# Patient Record
Sex: Female | Born: 1985 | Race: White | Hispanic: No | Marital: Married | Smoking: Never smoker
Health system: Southern US, Community
[De-identification: ages and names within clinical notes are randomized; demographics above are authoritative.]

## PROBLEM LIST (undated history)

## (undated) DIAGNOSIS — Z803 Family history of malignant neoplasm of breast: Secondary | ICD-10-CM

## (undated) DIAGNOSIS — O99345 Other mental disorders complicating the puerperium: Secondary | ICD-10-CM

## (undated) DIAGNOSIS — F53 Postpartum depression: Secondary | ICD-10-CM

## (undated) DIAGNOSIS — J45909 Unspecified asthma, uncomplicated: Secondary | ICD-10-CM

## (undated) DIAGNOSIS — Z8041 Family history of malignant neoplasm of ovary: Secondary | ICD-10-CM

## (undated) HISTORY — DX: Postpartum depression: F53.0

## (undated) HISTORY — DX: Other mental disorders complicating the puerperium: O99.345

## (undated) HISTORY — DX: Unspecified asthma, uncomplicated: J45.909

## (undated) HISTORY — DX: Family history of malignant neoplasm of ovary: Z80.41

## (undated) HISTORY — DX: Family history of malignant neoplasm of breast: Z80.3

---
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
## 2013-04-19 DIAGNOSIS — Z9189 Other specified personal risk factors, not elsewhere classified: Secondary | ICD-10-CM

## 2013-04-19 HISTORY — DX: Other specified personal risk factors, not elsewhere classified: Z91.89

## 2013-07-13 ENCOUNTER — Observation Stay: Payer: Self-pay | Admitting: Obstetrics and Gynecology

## 2013-07-14 ENCOUNTER — Inpatient Hospital Stay: Payer: Self-pay | Admitting: Obstetrics and Gynecology

## 2013-07-14 LAB — CBC WITH DIFFERENTIAL/PLATELET
Basophil #: 0.1 10*3/uL (ref 0.0–0.1)
Basophil %: 0.5 %
Eosinophil #: 0.2 10*3/uL (ref 0.0–0.7)
Eosinophil %: 1.3 %
HCT: 39.2 % (ref 35.0–47.0)
HGB: 13.2 g/dL (ref 12.0–16.0)
Lymphocyte #: 1.8 10*3/uL (ref 1.0–3.6)
Lymphocyte %: 9.4 %
MCH: 30.4 pg (ref 26.0–34.0)
MCHC: 33.7 g/dL (ref 32.0–36.0)
MCV: 90 fL (ref 80–100)
Monocyte #: 1.4 x10 3/mm — ABNORMAL HIGH (ref 0.2–0.9)
Monocyte %: 7.3 %
Neutrophil #: 15.6 10*3/uL — ABNORMAL HIGH (ref 1.4–6.5)
Neutrophil %: 81.5 %
Platelet: 211 10*3/uL (ref 150–440)
RBC: 4.35 10*6/uL (ref 3.80–5.20)
RDW: 13.7 % (ref 11.5–14.5)
WBC: 19.1 10*3/uL — ABNORMAL HIGH (ref 3.6–11.0)

## 2013-07-15 LAB — HEMATOCRIT: HCT: 31.8 % — ABNORMAL LOW (ref 35.0–47.0)

## 2014-08-27 NOTE — H&P (Signed)
L&D Evaluation:  History Expanded:  HPI 29 yo G1 at 96w3dgestational age by LMP consistent with 8 week ultrasound.  Patient's pregnancy complicated by history of asthma.  She presents with regular uterine contractions. She notes positive fetal movement, no leakage of fluid, and no vaginal bleeding.   Blood Type (Maternal) A positive   Group B Strep Results Maternal (Result >5wks must be treated as unknown) negative   Maternal HIV Negative   Maternal Syphilis Ab Nonreactive   Maternal Varicella Immune   Rubella Results (Maternal) nonimmune   [facility]   Patient's Medical History 1) asthma, 2) family history of breast cancer, 3) family history of ovarian cancer   Patient's Surgical History none   Medications Pre Natal Vitamins   Allergies antihistamines (bronchosmasms)   Social History none   Family History Non-Contributory   ROS:  ROS All systems were reviewed.  HEENT, CNS, GI, GU, Respiratory, CV, Renal and Musculoskeletal systems were found to be normal., Unless otherwise noted in HPI   Exam:  Vital Signs T 98.6, BP 128/81, P96, RR 20   General no apparent distress   Mental Status clear   Chest clear   Heart normal sinus rhythm   Abdomen gravid, non-tender   Estimated Fetal Weight 7.5 pounds   Fetal Position vtx   Back no CVAT   Edema no edema   Pelvic no external lesions, 5cm per RN   Mebranes Intact   FHT normal rate with no decels   FHT Description 120/mod var/+accels/no decels   Ucx irregular, not tracing well   Skin no lesions   Impression:  Impression active labor, reactive NST   Plan:  Comments 1) Labor: expectant management  2) Fetus - category I tracing  3) PNL A positive / ABSC negative / RNI - MMR postpartum / VZI / HIV neg / RPR NR / HBsAg neg / declined genetic screening / 1-hr OGTT 99 / GBS negative  4) TDAP given 04/27/13  5) Disposition - home postpartum   Labs:  Lab Results:  Routine Hem:  28-Mar-15 01:19    WBC (CBC)  19.1  RBC (CBC) 4.35  Hemoglobin (CBC) 13.2  Hematocrit (CBC) 39.2  Platelet Count (CBC) 211  MCV 90  MCH 30.4  MCHC 33.7  RDW 13.7  Neutrophil % 81.5  Lymphocyte % 9.4  Monocyte % 7.3  Eosinophil % 1.3  Basophil % 0.5  Neutrophil #  15.6  Lymphocyte # 1.8  Monocyte #  1.4  Eosinophil # 0.2  Basophil # 0.1 (Result(s) reported on 14 Jul 2013 at 02:15AM.)   Electronic Signatures: JWill Bonnet(MD)  (Signed 28-Mar-15 02:53)  Authored: L&D Evaluation, Labs   Last Updated: 28-Mar-15 02:53 by JWill Bonnet(MD)

## 2016-04-22 LAB — HM PAP SMEAR: HM PAP: NEGATIVE

## 2017-05-04 ENCOUNTER — Ambulatory Visit (INDEPENDENT_AMBULATORY_CARE_PROVIDER_SITE_OTHER): Payer: Managed Care, Other (non HMO) | Admitting: Advanced Practice Midwife

## 2017-05-04 ENCOUNTER — Encounter: Payer: Self-pay | Admitting: Advanced Practice Midwife

## 2017-05-04 VITALS — BP 140/80 | HR 60 | Ht 66.5 in | Wt 178.0 lb

## 2017-05-04 DIAGNOSIS — Z3044 Encounter for surveillance of vaginal ring hormonal contraceptive device: Secondary | ICD-10-CM

## 2017-05-04 DIAGNOSIS — Z01419 Encounter for gynecological examination (general) (routine) without abnormal findings: Secondary | ICD-10-CM

## 2017-05-04 DIAGNOSIS — Z0000 Encounter for general adult medical examination without abnormal findings: Secondary | ICD-10-CM

## 2017-05-04 MED ORDER — ETONOGESTREL-ETHINYL ESTRADIOL 0.12-0.015 MG/24HR VA RING: 1.0000 | VAGINAL_RING | VAGINAL | 11 refills | Status: DC

## 2017-05-04 NOTE — Progress Notes (Addendum)
Patient ID: Samantha Kim, female   DOB: 02-07-1986, 32 y.o.   MRN: [id number]     Gynecology Annual Exam  PCP: Patient, No Pcp Per  Chief Complaint:  Chief Complaint  Patient presents with  . Gynecologic Exam    History of Present Illness: Patient is a 32 y.o. G1P1001 presents for annual exam. The patient has no complaints today. She does have questions regarding irregular heart beat noted today by RN at check in. She has noticed some fluttering but denies chest pain, dizziness, light headed, faintness, shortness of breath or swelling in her extremities. Discussion about possible causes. She will follow up with cardiologist. She also has a question about an existing mole. The mole has been there but recently had some peeling. She plans to follow up with dermatologist for screening.  LMP: Patient's last menstrual period was 04/21/2017 (exact date). Average Interval: regular, 28 days Duration of flow: 5 days Heavy Menses: no Clots: no Intermenstrual Bleeding: no Postcoital Bleeding: no Dysmenorrhea: no  The patient is sexually active. She currently uses NuvaRing vaginal inserts for contraception. She denies dyspareunia.  The patient does perform self breast exams.  There is notable family history of breast or ovarian cancer in her family. She has previously been offered genetic screening and declined.  The patient wears seatbelts: yes.   The patient has regular exercise: yes.  She admits healthy lifestyle diet, exercise and hydration.   The patient denies current symptoms of depression.    Review of Systems: Review of Systems  Constitutional: Negative.   HENT: Negative.   Eyes: Negative.   Respiratory: Negative.   Cardiovascular: Positive for palpitations.       She has noticed fluttering  Gastrointestinal: Negative.   Genitourinary: Negative.   Musculoskeletal: Negative.   Skin: Negative.        Various moles without significant changes. Mole between breasts has recently  had peeling skin.  Neurological: Negative.   Endo/Heme/Allergies: Negative.   Psychiatric/Behavioral: Negative.     Past Medical History:  Past Medical History:  Diagnosis Date  . Asthma   . Post partum depression     Past Surgical History:  Past Surgical History:  Procedure Laterality Date  . WISDOM TOOTH EXTRACTION  2003   age 6    Gynecologic History:  Patient's last menstrual period was 04/21/2017 (exact date). Contraception: NuvaRing vaginal inserts Last Pap: 2018 Results were: no abnormalities   Obstetric History: G1P1001  Family History:  Family History  Problem Relation Age of Onset  . Hypertension Mother   . Hypertension Father   . Cancer Paternal Aunt 40       ovarian  . Breast cancer Maternal Grandmother 30  . Cancer Maternal Grandmother 60       lumg  . Hypertension Maternal Grandmother   . Stroke Maternal Grandmother   . Thyroid disease Maternal Grandmother   . Cancer Maternal Grandfather 80       lymphoma  . Hypertension Maternal Grandfather   . Breast cancer Paternal Grandmother 60  . Diabetes Paternal Grandfather     Social History:  Social History   Socioeconomic History  . Marital status: Married    Spouse name: Not on file  . Number of children: Not on file  . Years of education: Not on file  . Highest education level: Not on file  Social Needs  . Financial resource strain: Not on file  . Food insecurity - worry: Not on file  . Food insecurity - inability: Not  on file  . Transportation needs - medical: Not on file  . Transportation needs - non-medical: Not on file  Occupational History  . Not on file  Tobacco Use  . Smoking status: Never Smoker  . Smokeless tobacco: Never Used  Substance and Sexual Activity  . Alcohol use: Yes    Comment: rarely  . Drug use: No  . Sexual activity: Yes    Birth control/protection: Inserts  Other Topics Concern  . Not on file  Social History Narrative  . Not on file    Allergies:    Allergies  Allergen Reactions  . Cetirizine Other (See Comments)    With severe bronchospasm  . Other Other (See Comments)    Asthma flare up antihistamines    Medications: Prior to Admission medications   Medication Sig Start Date End Date Taking? Authorizing Provider  albuterol (PROVENTIL HFA;VENTOLIN HFA) 108 (90 Base) MCG/ACT inhaler Inhale 2 puffs into the lungs as needed. 08/31/16  Yes [provider]  Calcium Carbonate-Vitamin D 600-400 MG-UNIT chew tablet Chew 2 capsules by mouth daily.   Yes [provider]  cholecalciferol (VITAMIN D) 1000 units tablet Take 1,000 Units by mouth daily.   Yes [provider]  etonogestrel-ethinyl estradiol (NUVARING) 0.12-0.015 MG/24HR vaginal ring Place 1 each vaginally every 30 (thirty) days. 05/04/17  Yes Tresea MallGledhill, Jartavious Mckimmy, CNM  montelukast (SINGULAIR) 10 MG tablet Take 1 tablet by mouth daily. 12/23/16  Yes [provider]    Physical Exam Vitals: Blood pressure 140/80, pulse 60, height 5' 6.5" (1.689 m), weight 178 lb (80.7 kg), last menstrual period 04/21/2017.  General: NAD HEENT: normocephalic, anicteric Thyroid: no enlargement, no palpable nodules Pulmonary: No increased work of breathing, CTAB Cardiovascular: irregular heart rate- generally slow with occasional extra beats, distal pulses 2+ Breast: Breast symmetrical, no tenderness, no palpable nodules or masses, no skin or nipple retraction present, no nipple discharge.  No axillary or supraclavicular lymphadenopathy. Abdomen: NABS, soft, non-tender, non-distended.  Umbilicus without lesions.  No hepatomegaly, splenomegaly or masses palpable. No evidence of hernia  Genitourinary:  Deferred for no PAP smear interval and no concerns. Shared decision making. Extremities: no edema, erythema, or tenderness Neurologic: Grossly intact Psychiatric: mood appropriate, affect full Skin: mole between breasts approximately 5 mm circular with slightly darker area  in center where skin peeled   Assessment: 32 y.o. G1P1001 routine annual exam  Plan: Problem List Items Addressed This Visit    None    Visit Diagnoses    Well woman exam without gynecological exam    -  Primary   Encounter for surveillance of vaginal ring hormonal contraceptive device       Relevant Medications   etonogestrel-ethinyl estradiol (NUVARING) 0.12-0.015 MG/24HR vaginal ring      1) STI screening was offered and declined  2) ASCCP guidelines and rational discussed.  Patient opts for every 3 years screening interval  3) Contraception - Nuva Ring  4) Routine healthcare maintenance including cholesterol, diabetes screening discussed managed by PCP   5) Patient to arrange follow up with cardiologist and dermatologist per discussion today  6) Follow up 1 year for routine annual exam  Tresea MallJane Jeffory Snelgrove, CNM

## 2017-12-05 ENCOUNTER — Other Ambulatory Visit: Payer: Self-pay | Admitting: Certified Nurse Midwife

## 2017-12-05 DIAGNOSIS — Z3044 Encounter for surveillance of vaginal ring hormonal contraceptive device: Secondary | ICD-10-CM

## 2017-12-05 MED ORDER — ETONOGESTREL-ETHINYL ESTRADIOL 0.12-0.015 MG/24HR VA RING: 1.0000 | VAGINAL_RING | VAGINAL | 1 refills | Status: DC

## 2018-02-24 DIAGNOSIS — E559 Vitamin D deficiency, unspecified: Secondary | ICD-10-CM | POA: Insufficient documentation

## 2018-02-24 DIAGNOSIS — J4520 Mild intermittent asthma, uncomplicated: Secondary | ICD-10-CM | POA: Insufficient documentation

## 2018-02-24 DIAGNOSIS — E663 Overweight: Secondary | ICD-10-CM | POA: Insufficient documentation

## 2018-04-20 ENCOUNTER — Telehealth: Payer: Self-pay

## 2018-04-20 NOTE — Telephone Encounter (Signed)
Pt states refill of nuvaring was denied. Annual not due until Feb.  If 1-2 more nuvarings could be called in that would be great.  [phone]  Spoke c pt who states pharm told her she was out of refills.  Adv 3 c one ref sent in Aug so she should have enough.  Adv I will call [facility] for pt to see about it tomorrow and if still problems to call me back.  [place] and [place] who said she does in fact have refills.  They will notify pt.

## 2018-05-31 ENCOUNTER — Ambulatory Visit (INDEPENDENT_AMBULATORY_CARE_PROVIDER_SITE_OTHER): Payer: Managed Care, Other (non HMO) | Admitting: Advanced Practice Midwife

## 2018-05-31 ENCOUNTER — Encounter: Payer: Self-pay | Admitting: Advanced Practice Midwife

## 2018-05-31 VITALS — BP 122/74 | Ht 66.5 in | Wt 162.0 lb

## 2018-05-31 DIAGNOSIS — Z3044 Encounter for surveillance of vaginal ring hormonal contraceptive device: Secondary | ICD-10-CM

## 2018-05-31 DIAGNOSIS — Z01419 Encounter for gynecological examination (general) (routine) without abnormal findings: Secondary | ICD-10-CM | POA: Diagnosis not present

## 2018-05-31 DIAGNOSIS — N9489 Other specified conditions associated with female genital organs and menstrual cycle: Secondary | ICD-10-CM

## 2018-05-31 DIAGNOSIS — Z0000 Encounter for general adult medical examination without abnormal findings: Secondary | ICD-10-CM

## 2018-05-31 MED ORDER — ETONOGESTREL-ETHINYL ESTRADIOL 0.12-0.015 MG/24HR VA RING: 1.0000 | VAGINAL_RING | VAGINAL | 4 refills | Status: DC

## 2018-05-31 NOTE — Patient Instructions (Signed)

## 2018-05-31 NOTE — Progress Notes (Signed)
Patient ID: Samantha CanterburyJennifer Kim, female   DOB: 01/14/1986, 33 y.o.   MRN: [id number]     Gynecology Annual Exam   PCP: Patient, No Pcp Per  Chief Complaint:  Chief Complaint  Patient presents with  . Annual Exam    History of Present Illness: Patient is a 33 y.o. G1P1001 presents for annual exam. The patient has complaint today of spotting in between periods for the past 2 months. She has had pain in her right lower quadrant at the same time as the spotting.   She followed up with cardiology after last year's visit and was found to have PVCs. She has no restrictions per cardiology. She also saw the dermatologist regarding the mole between her breasts and there was no problem with that.    LMP: Patient's last menstrual period was 05/17/2018. Average Interval: regular, 28 days Duration of flow: 5 days Heavy Menses: no Clots: no Intermenstrual Bleeding: yes Postcoital Bleeding: no Dysmenorrhea: no  The patient is sexually active. She currently uses NuvaRing vaginal inserts for contraception. She denies dyspareunia.  The patient does perform self breast exams.  There is notable family history of breast or ovarian cancer in her family. The patient continues to decline genetic screening.  The patient wears seatbelts: yes.   The patient has regular exercise: She is active with [employer] 5 days per week. She admits healthy diet and adequate hydration.    The patient denies current symptoms of depression.    Review of Systems: Review of Systems  Constitutional: Negative.   HENT: Negative.   Eyes: Negative.   Respiratory: Negative.   Cardiovascular: Negative.   Gastrointestinal: Negative.   Genitourinary:       Spotting between periods and right lower quadrant pain  Musculoskeletal: Negative.   Skin: Negative.   Neurological: Negative.   Endo/Heme/Allergies: Negative.   Psychiatric/Behavioral: Negative.     Past Medical History:  Past Medical History:  Diagnosis  Date  . Asthma   . Post partum depression     Past Surgical History:  Past Surgical History:  Procedure Laterality Date  . WISDOM TOOTH EXTRACTION  2003   age 33    Gynecologic History:  Patient's last menstrual period was 05/17/2018. Contraception: NuvaRing vaginal inserts Last Pap: 2 years ago Results were: no abnormalities   Obstetric History: G1P1001  Family History:  Family History  Problem Relation Age of Onset  . Hypertension Mother   . Hypertension Father   . Cancer Paternal Aunt 40       ovarian  . Breast cancer Maternal Grandmother 30  . Cancer Maternal Grandmother 60       lumg  . Hypertension Maternal Grandmother   . Stroke Maternal Grandmother   . Thyroid disease Maternal Grandmother   . Cancer Maternal Grandfather 80       lymphoma  . Hypertension Maternal Grandfather   . Breast cancer Paternal Grandmother [id number]  . Diabetes Paternal Grandfather     Social History:  Social History   Socioeconomic History  . Marital status: Married    Spouse name: Not on file  . Number of children: Not on file  . Years of education: Not on file  . Highest education level: Not on file  Occupational History  . Not on file  Social Needs  . Financial resource strain: Not on file  . Food insecurity:    Worry: Not on file    Inability: Not on file  . Transportation needs:  Medical: Not on file    Non-medical: Not on file  Tobacco Use  . Smoking status: Never Smoker  . Smokeless tobacco: Never Used  Substance and Sexual Activity  . Alcohol use: Yes    Comment: rarely  . Drug use: No  . Sexual activity: Yes    Birth control/protection: Inserts  Lifestyle  . Physical activity:    Days per week: Not on file    Minutes per session: Not on file  . Stress: Not on file  Relationships  . Social connections:    Talks on phone: Not on file    Gets together: Not on file    Attends religious service: Not on file    Active member of club or organization: Not on file     Attends meetings of clubs or organizations: Not on file    Relationship status: Not on file  . Intimate partner violence:    Fear of current or ex partner: Not on file    Emotionally abused: Not on file    Physically abused: Not on file    Forced sexual activity: Not on file  Other Topics Concern  . Not on file  Social History Narrative  . Not on file    Allergies:  Allergies  Allergen Reactions  . Cetirizine Other (See Comments)    With severe bronchospasm  . Other Other (See Comments)    Asthma flare up antihistamines    Medications: Prior to Admission medications   Medication Sig Start Date End Date Taking? Authorizing Provider  albuterol (PROVENTIL HFA;VENTOLIN HFA) 108 (90 Base) MCG/ACT inhaler Inhale 2 puffs into the lungs as needed. 08/31/16  Yes [provider]  Calcium Carbonate-Vitamin D 600-400 MG-UNIT chew tablet Chew 2 capsules by mouth daily.   Yes [provider]  cholecalciferol (VITAMIN D) 1000 units tablet Take 1,000 Units by mouth daily.   Yes [provider]  etonogestrel-ethinyl estradiol (NUVARING) 0.12-0.015 MG/24HR vaginal ring Place 1 each vaginally every 30 (thirty) days. 05/31/18  Yes Tresea Mall, CNM  montelukast (SINGULAIR) 10 MG tablet Take 1 tablet by mouth daily. 12/23/16  Yes [provider]  Loratadine 10 MG CAPS Take by mouth.    [provider]  Multiple Vitamin (MULTIVITAMIN) capsule Take by mouth.    [provider]    Physical Exam Vitals: Blood pressure 122/74, height 5' 6.5" (1.689 m), weight 162 lb (73.5 kg), last menstrual period 05/17/2018.  General: NAD HEENT: normocephalic, anicteric Thyroid: no enlargement, no palpable nodules Pulmonary: No increased work of breathing, CTAB Cardiovascular: RRR, distal pulses 2+ Breast: Breast symmetrical, no tenderness, no palpable nodules or masses, no skin or nipple retraction present, no nipple discharge.  No axillary or  supraclavicular lymphadenopathy. Abdomen: NABS, soft, non-tender, non-distended.  Umbilicus without lesions.  No hepatomegaly, splenomegaly or masses palpable. No evidence of hernia  Genitourinary:  External: Normal external female genitalia.  Normal urethral meatus, normal Bartholin's and Skene's glands.    Vagina: Normal vaginal mucosa, no evidence of prolapse.    Cervix: Grossly normal in appearance, no bleeding, no CMT  Uterus: Non-enlarged, mobile, normal contour.    Adnexa: left ovary non-enlarged, mild tenderness. Right ovary with mild enlargement and moderate tenderness to palpation   Rectal: deferred  Lymphatic: no evidence of inguinal lymphadenopathy Extremities: no edema, erythema, or tenderness Neurologic: Grossly intact Psychiatric: mood appropriate, affect full     Assessment: 33 y.o. G1P1001 routine annual exam  Plan: Problem List Items Addressed This Visit  None    Visit Diagnoses    Well woman exam without gynecological exam    -  Primary   Pain of ovary       Relevant Orders   US PELVIS TRANSVANGINAL NON-OB (TV ONLY)   Encounter for surveillance of vaginal ring hormonal contraceptive device       Relevant Medications   etonogestrel-ethinyl estradiol (NUVARING) 0.12-0.015 MG/24HR vaginal ring      1) STI screening  was offered and declined  2)  ASCCP guidelines and rationale discussed.  Patient opts for every 3 years screening interval  3) Contraception - the patient is currently using  NuvaRing vaginal inserts.  She is happy with her current form of contraception and plans to continue  4) Routine healthcare maintenance including cholesterol, diabetes screening discussed managed by PCP  5) Return in 1 week (on 06/07/2018). gyn ultrasound   Tresea MallJane Amaiya Scruton, CNM [facility], [facility] 05/31/2018, 3:06 PM

## 2018-06-23 ENCOUNTER — Encounter: Payer: Self-pay | Admitting: Advanced Practice Midwife

## 2018-06-23 ENCOUNTER — Ambulatory Visit (INDEPENDENT_AMBULATORY_CARE_PROVIDER_SITE_OTHER): Payer: Managed Care, Other (non HMO)

## 2018-06-23 ENCOUNTER — Ambulatory Visit (INDEPENDENT_AMBULATORY_CARE_PROVIDER_SITE_OTHER): Payer: Managed Care, Other (non HMO) | Admitting: Advanced Practice Midwife

## 2018-06-23 ENCOUNTER — Other Ambulatory Visit: Payer: Self-pay

## 2018-06-23 VITALS — BP 98/60 | HR 48 | Ht 66.5 in | Wt 162.0 lb

## 2018-06-23 DIAGNOSIS — N9489 Other specified conditions associated with female genital organs and menstrual cycle: Secondary | ICD-10-CM | POA: Diagnosis not present

## 2018-06-23 DIAGNOSIS — R102 Pelvic and perineal pain: Secondary | ICD-10-CM

## 2018-06-23 DIAGNOSIS — Z09 Encounter for follow-up examination after completed treatment for conditions other than malignant neoplasm: Secondary | ICD-10-CM

## 2018-06-23 NOTE — Progress Notes (Signed)
Patient ID: Samantha Kim, female   DOB: [birth year], 33 y.o.   MRN: [id number]  Reason for Visit: Follow-up (u/s cyst?), Right lower quadrant pain    Subjective:     HPI:  Samantha Kim is a 33 y.o. female here today for ultrasound follow up for right lower quadrant pain. She notes the pain has stopped since last visit and she did not have intermenstrual bleeding with her last period. She has no complaints today.  Past Medical History:  Diagnosis Date  . Asthma   . Post partum depression    Family History  Problem Relation Age of Onset  . Hypertension Mother   . Hypertension Father   . Cancer Paternal Aunt 40       ovarian  . Breast cancer Maternal Grandmother 30  . Cancer Maternal Grandmother 60       lumg  . Hypertension Maternal Grandmother   . Stroke Maternal Grandmother   . Thyroid disease Maternal Grandmother   . Cancer Maternal Grandfather 80       lymphoma  . Hypertension Maternal Grandfather   . Breast cancer Paternal Grandmother 47  . Diabetes Paternal Grandfather    Past Surgical History:  Procedure Laterality Date  . WISDOM TOOTH EXTRACTION  2003   age 75    Short Social History:  Social History   Tobacco Use  . Smoking status: Never Smoker  . Smokeless tobacco: Never Used  Substance Use Topics  . Alcohol use: Yes    Comment: rarely    Allergies  Allergen Reactions  . Cetirizine Other (See Comments)    With severe bronchospasm  . Other Other (See Comments)    Asthma flare up antihistamines    Current Outpatient Medications  Medication Sig Dispense Refill  . albuterol (PROVENTIL HFA;VENTOLIN HFA) 108 (90 Base) MCG/ACT inhaler Inhale 2 puffs into the lungs as needed.    . Calcium Carbonate-Vitamin D 600-400 MG-UNIT chew tablet Chew 2 capsules by mouth daily.    . cholecalciferol (VITAMIN D) 1000 units tablet Take 1,000 Units by mouth daily.    Marland Kitchen etonogestrel-ethinyl estradiol (NUVARING) 0.12-0.015 MG/24HR vaginal ring Place 1 each  vaginally every 30 (thirty) days. 3 each 4  . Loratadine 10 MG CAPS Take by mouth.    . montelukast (SINGULAIR) 10 MG tablet Take 1 tablet by mouth daily.    . Multiple Vitamin (MULTIVITAMIN) capsule Take by mouth.     No current facility-administered medications for this visit.     Review of Systems  Constitutional: Negative.   HENT: Negative.   Eyes: Negative.   Respiratory: Negative.   Cardiovascular: Negative.   Gastrointestinal: Negative.   Genitourinary: Negative.   Musculoskeletal: Negative.   Skin: Negative.   Neurological: Negative.   Endo/Heme/Allergies: Negative.   Psychiatric/Behavioral: Negative.        Objective:  Objective   Vitals:   06/23/18 0858  BP: 98/60  Pulse: (!) 48  Weight: 162 lb (73.5 kg)  Height: 5' 6.5" (1.689 m)   Body mass index is 25.76 kg/m. Constitutional: Well nourished, well developed female in no acute distress.  HEENT: normal Skin: Warm and dry.  Respiratory:  Normal respiratory effort Psych: Alert and Oriented x3. No memory deficits. Normal mood and affect.    Data: Patient Name: Samantha Kim DOB: 1985-12-06 MRN: [id number] ULTRASOUND REPORT  Location: [facility]  Date of Service: 06/23/2018    Indications:Pelvic Pain right Findings:  The uterus is anteverted and measures 7.3 x 4.2 x 3.5cm. Echo  texture is homogenous without evidence of focal masses.  The Endometrium measures 7.6 mm.  Right Ovary measures 2.5 x 1.8 x 1.5 cm. It is normal in appearance. Left Ovary measures 2.2 x 1.6 x 1.4 cm. It is normal in appearance. Survey of the adnexa demonstrates no adnexal masses. There is no free fluid in the cul de sac.  Bowel seen in right lower quadrant, patient's area of pain. No obvious abnormality.    Impression: 1. Normal gyn ultrasound.   Recommendations: 1.Clinical correlation with the patient's History and Physical Exam.  Darlina Guys, RDMS RVT     Assessment/Plan:     33 yo G1P1 female,  follow up ultrasound with no abnormalities  RTC as needed and in 1 year for annual exam  Tresea Mall CNM [facility]

## 2018-10-18 DIAGNOSIS — I493 Ventricular premature depolarization: Secondary | ICD-10-CM

## 2018-10-18 HISTORY — DX: Ventricular premature depolarization: I49.3

## 2019-06-04 ENCOUNTER — Encounter: Payer: Self-pay | Admitting: Obstetrics and Gynecology

## 2019-06-04 ENCOUNTER — Ambulatory Visit (INDEPENDENT_AMBULATORY_CARE_PROVIDER_SITE_OTHER): Payer: Managed Care, Other (non HMO) | Admitting: Obstetrics and Gynecology

## 2019-06-04 ENCOUNTER — Other Ambulatory Visit: Payer: Self-pay

## 2019-06-04 ENCOUNTER — Other Ambulatory Visit (HOSPITAL_COMMUNITY)
Admission: RE | Admit: 2019-06-04 | Discharge: 2019-06-04 | Disposition: A | Discharge status: Home / Self Care | Payer: Managed Care, Other (non HMO) | Source: Ambulatory Visit | Attending: Obstetrics and Gynecology | Admitting: Obstetrics and Gynecology

## 2019-06-04 VITALS — BP 100/70 | Ht 66.5 in | Wt 161.0 lb

## 2019-06-04 DIAGNOSIS — Z01419 Encounter for gynecological examination (general) (routine) without abnormal findings: Secondary | ICD-10-CM | POA: Diagnosis not present

## 2019-06-04 DIAGNOSIS — Z124 Encounter for screening for malignant neoplasm of cervix: Secondary | ICD-10-CM | POA: Diagnosis not present

## 2019-06-04 DIAGNOSIS — Z1151 Encounter for screening for human papillomavirus (HPV): Secondary | ICD-10-CM

## 2019-06-04 DIAGNOSIS — Z8041 Family history of malignant neoplasm of ovary: Secondary | ICD-10-CM | POA: Insufficient documentation

## 2019-06-04 DIAGNOSIS — Z803 Family history of malignant neoplasm of breast: Secondary | ICD-10-CM

## 2019-06-04 DIAGNOSIS — Z3044 Encounter for surveillance of vaginal ring hormonal contraceptive device: Secondary | ICD-10-CM

## 2019-06-04 DIAGNOSIS — Z9189 Other specified personal risk factors, not elsewhere classified: Secondary | ICD-10-CM | POA: Insufficient documentation

## 2019-06-04 DIAGNOSIS — Z1231 Encounter for screening mammogram for malignant neoplasm of breast: Secondary | ICD-10-CM

## 2019-06-04 MED ORDER — ETONOGESTREL-ETHINYL ESTRADIOL 0.12-0.015 MG/24HR VA RING: 1.0000 | VAGINAL_RING | VAGINAL | 3 refills | Status: DC

## 2019-06-04 NOTE — Patient Instructions (Signed)
I value your feedback and entrusting us with your care. If you get a [facility] patient survey, I would appreciate you taking the time to let us know about your experience today. Thank you!  As of March 29, 2019, your lab results will be released to your MyChart immediately, before I even have a chance to see them. Please give me time to review them and contact you if there are any abnormalities. Thank you for your patience.  

## 2019-06-04 NOTE — Progress Notes (Signed)
PCP:  Samantha Kim, No Pcp Per   Chief Complaint  Samantha Kim presents with  . Gynecologic Exam     HPI:      Samantha Kim is a 34 y.o. G1P1001 who LMP was Samantha Kim's last menstrual period was 05/17/2019 (exact date)., presents today for her annual examination.  Her menses are regular every 28-30 days, lasting 4-5 days.  Dysmenorrhea mild. She does not have intermenstrual bleeding.  Sex activity: single partner, contraception - NuvaRing vaginal inserts.  Last Pap: April 22, 2016  Results were: no abnormalities /neg HPV DNA 2016  Had baseline mammogram at work several yrs ago.  There is a FH of breast cancer in her MGM and PGM. There is a FH of ovarian cancer in her pat aunt. Pt has declined genetic testing in past. IBIS=28.9% in 2015. The Samantha Kim does do self-breast exams.  Tobacco use: The Samantha Kim denies current or previous tobacco use. Alcohol use: social drinker No drug use.  Exercise: moderately active  She does get adequate calcium and Vitamin D in her diet.   Past Medical History:  Diagnosis Date  . Asthma   . Family history of breast cancer   . Family history of ovarian cancer   . Increased risk of breast cancer 2015   IBIS-28.9%  . Post partum depression   . Premature ventricular contraction 10/2018    Past Surgical History:  Procedure Laterality Date  . WISDOM TOOTH EXTRACTION  2003   age 52    Family History  Problem Relation Age of Onset  . Hypertension Mother   . Hypertension Father   . Ovarian cancer Paternal Aunt 10  . Breast cancer Maternal Grandmother 30  . Cancer Maternal Grandmother 60       lumg  . Hypertension Maternal Grandmother   . Stroke Maternal Grandmother   . Thyroid disease Maternal Grandmother   . Cancer Maternal Grandfather 80       lymphoma  . Hypertension Maternal Grandfather   . Breast cancer Paternal Grandmother 41  . Diabetes Paternal Grandfather     Social History   Socioeconomic History  . Marital status: Married      Spouse name: Not on file  . Number of children: Not on file  . Years of education: Not on file  . Highest education level: Not on file  Occupational History  . Not on file  Tobacco Use  . Smoking status: Never Smoker  . Smokeless tobacco: Never Used  Substance and Sexual Activity  . Alcohol use: Yes    Comment: rarely  . Drug use: No  . Sexual activity: Yes    Birth control/protection: Inserts    Comment: Nuvaring  Other Topics Concern  . Not on file  Social History Narrative  . Not on file   Social Determinants of Health   Financial Resource Strain:   . Difficulty of Paying Living Expenses: Not on file  Food Insecurity:   . Worried About Charity fundraiser in the Last Year: Not on file  . Ran Out of Food in the Last Year: Not on file  Transportation Needs:   . Lack of Transportation (Medical): Not on file  . Lack of Transportation (Non-Medical): Not on file  Physical Activity:   . Days of Exercise per Week: Not on file  . Minutes of Exercise per Session: Not on file  Stress:   . Feeling of Stress : Not on file  Social Connections:   . Frequency of Communication with  Friends and Family: Not on file  . Frequency of Social Gatherings with Friends and Family: Not on file  . Attends Religious Services: Not on file  . Active Member of Clubs or Organizations: Not on file  . Attends Banker Meetings: Not on file  . Marital Status: Not on file  Intimate Partner Violence:   . Fear of Current or Ex-Partner: Not on file  . Emotionally Abused: Not on file  . Physically Abused: Not on file  . Sexually Abused: Not on file     Current Outpatient Medications:  .  albuterol (PROVENTIL HFA;VENTOLIN HFA) 108 (90 Base) MCG/ACT inhaler, Inhale 2 puffs into the lungs as needed., Disp: , Rfl:  .  Calcium Carbonate-Vitamin D 600-400 MG-UNIT chew tablet, Chew 2 capsules by mouth daily., Disp: , Rfl:  .  cholecalciferol (VITAMIN D) 1000 units tablet, Take 1,000 Units by  mouth daily., Disp: , Rfl:  .  etonogestrel-ethinyl estradiol (NUVARING) 0.12-0.015 MG/24HR vaginal ring, Place 1 each vaginally every 30 (thirty) days., Disp: 3 each, Rfl: 3 .  Loratadine 10 MG CAPS, Take by mouth., Disp: , Rfl:  .  metoprolol succinate (TOPROL-XL) 25 MG 24 hr tablet, Take by mouth., Disp: , Rfl:  .  montelukast (SINGULAIR) 10 MG tablet, Take 1 tablet by mouth daily., Disp: , Rfl:  .  Multiple Vitamin (MULTIVITAMIN) capsule, Take by mouth., Disp: , Rfl:      ROS:  Review of Systems  Constitutional: Positive for fatigue. Negative for fever and unexpected weight change.  Respiratory: Negative for cough, shortness of breath and wheezing.   Cardiovascular: Negative for chest pain, palpitations and leg swelling.  Gastrointestinal: Negative for blood in stool, constipation, diarrhea, nausea and vomiting.  Endocrine: Negative for cold intolerance, heat intolerance and polyuria.  Genitourinary: Negative for dyspareunia, dysuria, flank pain, frequency, genital sores, hematuria, menstrual problem, pelvic pain, urgency, vaginal bleeding, vaginal discharge and vaginal pain.  Musculoskeletal: Negative for back pain, joint swelling and myalgias.  Skin: Negative for rash.  Neurological: Negative for dizziness, syncope, light-headedness, numbness and headaches.  Hematological: Negative for adenopathy.  Psychiatric/Behavioral: Negative for agitation, confusion, sleep disturbance and suicidal ideas. The Samantha Kim is not nervous/anxious.    BREAST: No symptoms   Objective: BP 100/70   Ht 5' 6.5" (1.689 m)   Wt 161 lb (73 kg)   LMP 05/17/2019 (Exact Date)   BMI 25.60 kg/m    Physical Exam Constitutional:      Appearance: She is well-developed.  Genitourinary:     Vulva, vagina, cervix, uterus, right adnexa and left adnexa normal.     No vulval lesion or tenderness noted.     No vaginal discharge, erythema or tenderness.     No cervical polyp.     Uterus is not enlarged or  tender.     No right or left adnexal mass present.     Right adnexa not tender.     Left adnexa not tender.  Neck:     Thyroid: No thyromegaly.  Cardiovascular:     Rate and Rhythm: Normal rate and regular rhythm.     Heart sounds: Normal heart sounds. No murmur.  Pulmonary:     Effort: Pulmonary effort is normal.     Breath sounds: Normal breath sounds.  Chest:     Breasts:        Right: No mass, nipple discharge, skin change or tenderness.        Left: No mass, nipple discharge, skin change or  tenderness.  Abdominal:     Palpations: Abdomen is soft.     Tenderness: There is no abdominal tenderness. There is no guarding.  Musculoskeletal:        General: Normal range of motion.     Cervical back: Normal range of motion.  Neurological:     General: No focal deficit present.     Mental Status: She is alert and oriented to person, place, and time.     Cranial Nerves: No cranial nerve deficit.  Skin:    General: Skin is warm and dry.  Psychiatric:        Mood and Affect: Mood normal.        Behavior: Behavior normal.        Thought Content: Thought content normal.        Judgment: Judgment normal.  Vitals reviewed.     Assessment/Plan: Encounter for annual routine gynecological examination  Cervical cancer screening - Plan: Cytology - PAP  Screening for HPV (human papillomavirus) - Plan: Cytology - PAP  Family history of breast cancer - Plan: MM 3D SCREEN BREAST BILATERAL; MyRisk testing discussed and pt declines. Increased risk of breast cancer calculated in 2015. Pt interested in starting mammos. Pt to sched, will f/u with results. Cont SBE, yearly CBE.   Family history of ovarian cancer--MyRisk testing discussed and declined.  Encounter for screening mammogram for malignant neoplasm of breast - Plan: MM 3D SCREEN BREAST BILATERAL  Encounter for surveillance of vaginal ring hormonal contraceptive device - Plan: etonogestrel-ethinyl estradiol (NUVARING) 0.12-0.015  MG/24HR vaginal ring; Rx RF.   Meds ordered this encounter  Medications  . etonogestrel-ethinyl estradiol (NUVARING) 0.12-0.015 MG/24HR vaginal ring    Sig: Place 1 each vaginally every 30 (thirty) days.    Dispense:  3 each    Refill:  3    Order Specific Question:   Supervising Provider    Answer:   Nadara Mustard [[id number]]             GYN counsel breast self exam, mammography screening, adequate intake of calcium and vitamin D, diet and exercise     F/U  Return in about 1 year (around 06/03/2020).  Cyd Hostler B. Marlen Mollica, PA-C 06/04/2019 2:46 PM

## 2019-06-06 LAB — CYTOLOGY - PAP
Comment: NEGATIVE
Diagnosis: NEGATIVE
High risk HPV: NEGATIVE

## 2019-07-29 ENCOUNTER — Other Ambulatory Visit: Payer: Self-pay | Admitting: Advanced Practice Midwife

## 2019-07-29 DIAGNOSIS — Z3044 Encounter for surveillance of vaginal ring hormonal contraceptive device: Secondary | ICD-10-CM

## 2019-07-30 NOTE — Telephone Encounter (Signed)
Pt saw ABC 

## 2019-12-18 ENCOUNTER — Ambulatory Visit
Admission: RE | Admit: 2019-12-18 | Discharge: 2019-12-18 | Disposition: A | Discharge status: Home / Self Care | Payer: Managed Care, Other (non HMO) | Source: Ambulatory Visit | Attending: Obstetrics and Gynecology | Admitting: Obstetrics and Gynecology

## 2019-12-18 ENCOUNTER — Other Ambulatory Visit: Payer: Self-pay

## 2019-12-18 DIAGNOSIS — Z1231 Encounter for screening mammogram for malignant neoplasm of breast: Secondary | ICD-10-CM | POA: Diagnosis not present

## 2019-12-18 DIAGNOSIS — Z803 Family history of malignant neoplasm of breast: Secondary | ICD-10-CM | POA: Diagnosis not present

## 2019-12-31 ENCOUNTER — Encounter: Payer: Self-pay | Admitting: Obstetrics and Gynecology

## 2019-12-31 ENCOUNTER — Other Ambulatory Visit: Payer: Self-pay | Admitting: *Deleted

## 2019-12-31 ENCOUNTER — Inpatient Hospital Stay
Admission: RE | Admit: 2019-12-31 | Discharge: 2019-12-31 | Disposition: A | Discharge status: Home / Self Care | Payer: Self-pay | Source: Ambulatory Visit | Attending: *Deleted | Admitting: *Deleted

## 2019-12-31 DIAGNOSIS — Z1231 Encounter for screening mammogram for malignant neoplasm of breast: Secondary | ICD-10-CM

## 2021-04-19 NOTE — L&D Delivery Note (Signed)
Obstetrical Delivery Note   Date of Delivery:   12/12/2021 Primary OB:   [facility] Gestational Age/EDD: [redacted]w[redacted]d (Dated by 11wk [place]) Reason for Admission: SROM Antepartum complications: none  Delivered By:   Siri Cole, CNM  Delivery Type:   spontaneous vaginal delivery  Delivery Details:   Contractions started around 1500 on 8/25, SROM for light mec at 2300. Arrived in early labor, augmented with 1 dose of Cytotec.  Was given Fentanyl for pain relief.  Around 0500 pt called out feeling the need to push, complete at that time. While reclined, SVB of viable female at 0508, infant birthed OA to LOA, birthed through nuchal cord.  To mother's abdomen, vigorous cry, HR >100,  slow to pink up, cord doubly clamped and cut by dad-infant taken to warmer for assessment. Placenta delivered by maternal effort. On inspection of perineum a few abrasions noted near urethra and right labia, all areas hemostatic. Infant placed back on mother for skin to skin. Both stable.  Anesthesia:    Fentanyl  Intrapartum complications: None GBS:    Negative Laceration:    none Episiotomy:    none Rectal exam:   deferred Placenta:    Delivered and expressed via active management. Intact: yes. To pathology: no.  Delayed Cord Clamping: yes Estimated Blood Loss:   Baby:    Liveborn female, APGARs 8/9, weight 3510gm  Carie Caddy, CNM  [facility], [facility]  @TODAY @  5:46 AM

## 2021-04-27 ENCOUNTER — Encounter: Payer: Self-pay | Admitting: Obstetrics

## 2021-04-27 ENCOUNTER — Ambulatory Visit (INDEPENDENT_AMBULATORY_CARE_PROVIDER_SITE_OTHER): Payer: BC Managed Care – PPO | Admitting: Obstetrics

## 2021-04-27 ENCOUNTER — Other Ambulatory Visit: Payer: Self-pay

## 2021-04-27 ENCOUNTER — Other Ambulatory Visit: Payer: Self-pay | Admitting: Obstetrics

## 2021-04-27 ENCOUNTER — Other Ambulatory Visit (HOSPITAL_COMMUNITY)
Admission: RE | Admit: 2021-04-27 | Discharge: 2021-04-27 | Disposition: A | Discharge status: Home / Self Care | Payer: BC Managed Care – PPO | Source: Ambulatory Visit | Attending: Obstetrics | Admitting: Obstetrics

## 2021-04-27 VITALS — BP 122/70 | Wt 178.0 lb

## 2021-04-27 DIAGNOSIS — Z113 Encounter for screening for infections with a predominantly sexual mode of transmission: Secondary | ICD-10-CM | POA: Diagnosis present

## 2021-04-27 DIAGNOSIS — Z3480 Encounter for supervision of other normal pregnancy, unspecified trimester: Secondary | ICD-10-CM

## 2021-04-27 DIAGNOSIS — Z3A01 Less than 8 weeks gestation of pregnancy: Secondary | ICD-10-CM

## 2021-04-27 NOTE — Progress Notes (Signed)
New Obstetric Patient H&P    Chief Complaint: "Desires prenatal care"   History of Present Illness: Patient is a 36 y.o. G2P1001 Not Hispanic or Latino female, LMP 03/11/2021 presents with amenorrhea and positive home pregnancy test. Based on her  LMP, her EDD is Estimated Date of Delivery: 12/16/21 and her EGA is [redacted]w[redacted]d. Cycles are 7. days, regular, and occur approximately every : 28 days. Her last pap smear was about 2 years ago and was no abnormalities.    She had a urine pregnancy test which was positive about 3 week(s)  ago. Her last menstrual period was normal and lasted for  about 5 day(s). Since her LMP she claims she has experienced breast tenderness, some nausea. She denies vaginal bleeding. Her past medical history is noncontributory. Her prior pregnancies are notable for none  Since her LMP, she admits to the use of tobacco products  no She claims she has gained   no pounds since the start of her pregnancy.  There are cats in the home in the home  no She admits close contact with children on a regular basis  yes  She has had chicken pox in the past yes She has had Tuberculosis exposures, symptoms, or previously tested positive for TB   no Current or past history of domestic violence. no  Genetic Screening/Teratology Counseling: (Includes patient, baby's father, or anyone in either family with:)   1. Patient's age >/= 58 [facility]  yes 2. Thalassemia ([place], [place], Mediterranean, or Asian background): MCV<80  no 3. Neural tube defect (meningomyelocele, spina bifida, anencephaly)  no 4. Congenital heart defect  no  5. Down syndrome  no 6. Tay-Sachs (Jewish, [place]  no 7. Canavan's Disease  no 8. Sickle cell disease or trait (African)  no  9. Hemophilia or other blood disorders  no  10. Muscular dystrophy  no  11. Cystic fibrosis  no  12. Huntington's Chorea  no  13. Mental retardation/autism  no 14. Other inherited genetic or chromosomal disorder  no 15.  Maternal metabolic disorder (DM, PKU, etc)  no 16. Patient or FOB with a child with a birth defect not listed above no  16a. Patient or FOB with a birth defect themselves no 17. Recurrent pregnancy loss, or stillbirth  no  18. Any medications since LMP other than prenatal vitamins (include vitamins, supplements, OTC meds, drugs, alcohol) yes. She takes medication for cardiac issue 19. Any other genetic/environmental exposure to discuss  no  Infection History:   1. Lives with someone with TB or TB exposed  no  2. Patient or partner has history of genital herpes  no 3. Rash or viral illness since LMP  no 4. History of STI (GC, CT, HPV, syphilis, HIV)  no 5. History of recent travel :  no  Other pertinent information:  no     Review of Systems:10 point review of systems negative unless otherwise noted in HPI  Past Medical History:  Past Medical History:  Diagnosis Date   Asthma    Family history of breast cancer    Family history of ovarian cancer    Increased risk of breast cancer 2015   IBIS-28.9%   Post partum depression    Premature ventricular contraction 10/2018    Past Surgical History:  Past Surgical History:  Procedure Laterality Date   WISDOM TOOTH EXTRACTION  2003   age 57    Gynecologic History: Patient's last menstrual period was 03/11/2021 (exact  date).  Obstetric History: G2P1001  Family History:  Family History  Problem Relation Age of Onset   Hypertension Mother    Hypertension Father    Ovarian cancer Paternal Aunt 3940   Breast cancer Maternal Grandmother 30   Cancer Maternal Grandmother 460       lumg   Hypertension Maternal Grandmother    Stroke Maternal Grandmother    Thyroid disease Maternal Grandmother    Cancer Maternal Grandfather [phone]       lymphoma   Hypertension Maternal Grandfather    Breast cancer Paternal Grandmother 770   Diabetes Paternal Grandfather     Social History:  Social History   Socioeconomic  History   Marital status: Married    Spouse name: Not on file   Number of children: Not on file   Years of education: Not on file   Highest education level: Not on file  Occupational History   Not on file  Tobacco Use   Smoking status: Never   Smokeless tobacco: Never  Vaping Use   Vaping Use: Never used  Substance and Sexual Activity   Alcohol use: Yes    Comment: rarely   Drug use: No   Sexual activity: Yes  Other Topics Concern   Not on file  Social History Narrative   Not on file   Social Determinants of Health   Financial Resource Strain: Not on file  Food Insecurity: Not on file  Transportation Needs: Not on file  Physical Activity: Not on file  Stress: Not on file  Social Connections: Not on file  Intimate Partner Violence: Not on file    Allergies:  Allergies  Allergen Reactions   Cetirizine Other (See Comments)    With severe bronchospasm   Other Other (See Comments)    Asthma flare up antihistamines    Medications: Prior to Admission medications   Medication Sig Start Date End Date Taking? Authorizing Provider  albuterol (PROVENTIL HFA;VENTOLIN HFA) 108 (90 Base) MCG/ACT inhaler Inhale 2 puffs into the lungs as needed. 08/31/16  Yes [provider]  metoprolol succinate (TOPROL-XL) 25 MG 24 hr tablet Take by mouth. 05/08/19  Yes [provider]  Multiple Vitamin (MULTIVITAMIN) capsule Take by mouth.   Yes [provider]    Physical Exam Vitals: Blood pressure 122/70, weight 178 lb (80.7 kg), last menstrual period 03/11/2021.  General: NAD HEENT: normocephalic, anicteric Thyroid: no enlargement, no palpable nodules Pulmonary: No increased work of breathing, CTAB Cardiovascular: RRR, distal pulses 2+ Abdomen: NABS, soft, non-tender, non-distended.  Umbilicus without lesions.  No hepatomegaly, splenomegaly or masses palpable. No evidence of hernia  Genitourinary:  External: Normal external female genitalia.   Normal urethral meatus, normal  Bartholin's and Skene's glands.    Vagina: Normal vaginal mucosa, no evidence of prolapse.    Cervix: Grossly normal in appearance, no bleeding  Uterus: anteverted, Non-enlarged, mobile, normal contour.  No CMT  Adnexa: ovaries non-enlarged, no adnexal masses  Rectal: deferred Extremities: no edema, erythema, or tenderness Neurologic: Grossly intact Psychiatric: mood appropriate, affect full   Assessment: 36 y.o. G2P1001 at 2311w5d presenting to initiate prenatal care  Plan: 1) Avoid alcoholic beverages. 2) Patient encouraged not to smoke.  3) Discontinue the use of all non-medicinal drugs and chemicals.  4) Take prenatal vitamins daily.  5) Nutrition, food safety (fish, cheese advisories, and high nitrite foods) and exercise discussed. 6) Hospital and practice style discussed with cross coverage system.  7) Genetic Screening, such as with 1st Trimester Screening,  cell free fetal DNA, AFP testing, and Ultrasound, as well as with amniocentesis and CVS as appropriate, is discussed with patient. At the conclusion of today's visit patient requested genetic testing 8) Patient is asked about travel to areas at risk for the Zika virus, and counseled to avoid travel and exposure to mosquitoes or sexual partners who may have themselves been exposed to the virus. Testing is discussed, and will be ordered as appropriate.   She would request the MaternT testing at next visit. 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 Would likely start on this after delivery. Mirna Mires, CNM  04/27/2021 3:51 PM

## 2021-04-27 NOTE — Progress Notes (Signed)
No  vb no lof. NOB today.

## 2021-04-29 LAB — CERVICOVAGINAL ANCILLARY ONLY
Bacterial Vaginitis (gardnerella): NEGATIVE
Candida Glabrata: NEGATIVE
Candida Vaginitis: NEGATIVE
Chlamydia: NEGATIVE
Comment: NEGATIVE
Comment: NEGATIVE
Comment: NEGATIVE
Comment: NEGATIVE
Comment: NEGATIVE
Comment: NORMAL
Neisseria Gonorrhea: NEGATIVE
Trichomonas: NEGATIVE

## 2021-04-29 LAB — URINE CULTURE

## 2021-05-21 ENCOUNTER — Other Ambulatory Visit: Payer: Self-pay | Admitting: Obstetrics

## 2021-05-21 ENCOUNTER — Ambulatory Visit
Admission: RE | Admit: 2021-05-21 | Discharge: 2021-05-21 | Disposition: A | Discharge status: Home / Self Care | Payer: BC Managed Care – PPO | Source: Ambulatory Visit | Attending: Obstetrics | Admitting: Obstetrics

## 2021-05-21 ENCOUNTER — Other Ambulatory Visit: Payer: Self-pay

## 2021-05-21 DIAGNOSIS — Z3687 Encounter for antenatal screening for uncertain dates: Secondary | ICD-10-CM | POA: Insufficient documentation

## 2021-05-21 DIAGNOSIS — Z3A11 11 weeks gestation of pregnancy: Secondary | ICD-10-CM | POA: Diagnosis not present

## 2021-05-21 DIAGNOSIS — Z3480 Encounter for supervision of other normal pregnancy, unspecified trimester: Secondary | ICD-10-CM

## 2021-05-22 ENCOUNTER — Ambulatory Visit (INDEPENDENT_AMBULATORY_CARE_PROVIDER_SITE_OTHER): Payer: BC Managed Care – PPO | Admitting: Obstetrics & Gynecology

## 2021-05-22 ENCOUNTER — Encounter: Payer: Self-pay | Admitting: Obstetrics & Gynecology

## 2021-05-22 VITALS — BP 120/80 | Wt 173.0 lb

## 2021-05-22 DIAGNOSIS — O09521 Supervision of elderly multigravida, first trimester: Secondary | ICD-10-CM | POA: Insufficient documentation

## 2021-05-22 DIAGNOSIS — Z3480 Encounter for supervision of other normal pregnancy, unspecified trimester: Secondary | ICD-10-CM

## 2021-05-22 DIAGNOSIS — Z3481 Encounter for supervision of other normal pregnancy, first trimester: Secondary | ICD-10-CM

## 2021-05-22 DIAGNOSIS — Z3689 Encounter for other specified antenatal screening: Secondary | ICD-10-CM

## 2021-05-22 DIAGNOSIS — O09529 Supervision of elderly multigravida, unspecified trimester: Secondary | ICD-10-CM | POA: Insufficient documentation

## 2021-05-22 DIAGNOSIS — Z3A11 11 weeks gestation of pregnancy: Secondary | ICD-10-CM

## 2021-05-22 DIAGNOSIS — Z1379 Encounter for other screening for genetic and chromosomal anomalies: Secondary | ICD-10-CM

## 2021-05-22 LAB — POCT URINALYSIS DIPSTICK OB
Glucose, UA: NEGATIVE
POC,PROTEIN,UA: NEGATIVE

## 2021-05-22 LAB — OB RESULTS CONSOLE VARICELLA ZOSTER ANTIBODY, IGG: Varicella: IMMUNE

## 2021-05-22 NOTE — Patient Instructions (Signed)

## 2021-05-22 NOTE — Progress Notes (Signed)
°  Subjective  Mild nausea, no meds desired at this time No pain or bleeding  Objective  BP 120/80    Wt 173 lb (78.5 kg)    LMP 03/11/2021 (Exact Date)    BMI 27.50 kg/m  General: NAD Pumonary: no increased work of breathing Abdomen: gravid, non-tender Extremities: no edema Psychiatric: mood appropriate, affect full  Assessment  36 y.o. G2P1001 at [redacted]w[redacted]d by  12/09/2021, by Ultrasound presenting for routine prenatal visit  Plan   Problem List Items Addressed This Visit      Other   Supervision of other normal pregnancy, antepartum - Primary   Multigravida of advanced maternal age in first trimester   Relevant Orders   [place] MFM OB DETAIL +14 WK  Other Visit Diagnoses    [redacted] weeks gestation of pregnancy       Encounter for genetic screening for Down Syndrome       Relevant Orders   MaterniT21 PLUS Core+SCA   Screening, antenatal, for fetal anatomic survey       Relevant Orders   [place] MFM OB DETAIL +14 WK    [place] discussed    Review of ULTRASOUND.    I have personally reviewed images and report of recent ultrasound done at [facility].    Plan of management to be discussed with patient. PNV Discussed nausea meds, as needed AMA, will plan MFM [place] for anatomy screen Cont Metoprolol Labs And NIPT today  pregnancy Problems (from 04/27/21 to present)    Problem Noted Resolved   Multigravida of advanced maternal age in first trimester 05/22/2021 by Nadara Mustard, MD No   Supervision of other normal pregnancy, antepartum 04/27/2021 by Mirna Mires, CNM No   Overview Addendum 05/22/2021  8:23 AM by Nadara Mustard, MD     Nursing Staff Provider  Office Location  [facility] Dating  By [place] at 11 weeks  Language  English Anatomy US    Flu Vaccine   Genetic Screen  NIPS:   TDaP vaccine    Hgb A1C or  GTT Third trimester :   Covid    LAB RESULTS   Rhogam   Blood Type     Feeding Plan  Antibody    Contraception  Rubella    Circumcision  RPR     Pediatrician   HBsAg     Support Person   HIV    Prenatal Classes  Varicella     GBS  (For PCN allergy, check sensitivities)   BTL Consent     VBAC Consent n/a Pap  2021; needs postpartum    Hgb Electro      CF      SMA                   Annamarie Major, MD, [facility], [facility] 05/22/2021  8:44 AM

## 2021-05-22 NOTE — Addendum Note (Signed)
Addended by: Cornelius Moras D on: 05/22/2021 08:48 AM   Modules accepted: Orders

## 2021-05-23 LAB — CBC/D/PLT+RPR+RH+ABO+RUBIGG...
Antibody Screen: NEGATIVE
Basophils Absolute: 0.1 10*3/uL (ref 0.0–0.2)
Basos: 1 %
EOS (ABSOLUTE): 0.1 10*3/uL (ref 0.0–0.4)
Eos: 1 %
HCV Ab: <0.1 s/co ratio (ref 0.0–0.9)
HIV Screen 4th Generation wRfx: NONREACTIVE
Hematocrit: 41.3 % (ref 34.0–46.6)
Hemoglobin: 14 g/dL (ref 11.1–15.9)
Hepatitis B Surface Ag: NEGATIVE
Immature Grans (Abs): 0 10*3/uL (ref 0.0–0.1)
Immature Granulocytes: 0 %
Lymphocytes Absolute: 1.8 10*3/uL (ref 0.7–3.1)
Lymphs: 17 %
MCH: 29.7 pg (ref 26.6–33.0)
MCHC: 33.9 g/dL (ref 31.5–35.7)
MCV: 88 fL (ref 79–97)
Monocytes Absolute: 0.9 10*3/uL (ref 0.1–0.9)
Monocytes: 8 %
Neutrophils Absolute: 7.8 10*3/uL — ABNORMAL HIGH (ref 1.4–7.0)
Neutrophils: 73 %
Platelets: 291 10*3/uL (ref 150–450)
RBC: 4.71 x10E6/uL (ref 3.77–5.28)
RDW: 12.5 % (ref 11.7–15.4)
RPR Ser Ql: NONREACTIVE
Rh Factor: POSITIVE
Rubella Antibodies, IGG: <0.9 index — ABNORMAL LOW (ref >0.99)
Varicella zoster IgG: 875 index (ref >165)
WBC: 10.6 10*3/uL (ref 3.4–10.8)

## 2021-05-23 LAB — HCV INTERPRETATION

## 2021-05-26 ENCOUNTER — Encounter: Payer: BC Managed Care – PPO | Admitting: Obstetrics

## 2021-05-30 LAB — MATERNIT21 PLUS CORE+SCA
Fetal Fraction: 5
Monosomy X (Turner Syndrome): NOT DETECTED
Result (T21): NEGATIVE
Trisomy 13 (Patau syndrome): NEGATIVE
Trisomy 18 (Edwards syndrome): NEGATIVE
Trisomy 21 (Down syndrome): NEGATIVE
XXX (Triple X Syndrome): NOT DETECTED
XXY (Klinefelter Syndrome): NOT DETECTED
XYY (Jacobs Syndrome): NOT DETECTED

## 2021-06-18 ENCOUNTER — Other Ambulatory Visit: Payer: Self-pay

## 2021-06-18 ENCOUNTER — Ambulatory Visit (INDEPENDENT_AMBULATORY_CARE_PROVIDER_SITE_OTHER): Payer: BC Managed Care – PPO | Admitting: Obstetrics and Gynecology

## 2021-06-18 VITALS — BP 118/70 | Wt 176.0 lb

## 2021-06-18 DIAGNOSIS — Z3482 Encounter for supervision of other normal pregnancy, second trimester: Secondary | ICD-10-CM

## 2021-06-18 DIAGNOSIS — O09522 Supervision of elderly multigravida, second trimester: Secondary | ICD-10-CM

## 2021-06-18 DIAGNOSIS — Z3A15 15 weeks gestation of pregnancy: Secondary | ICD-10-CM

## 2021-06-18 LAB — POCT URINALYSIS DIPSTICK OB
Glucose, UA: NEGATIVE
POC,PROTEIN,UA: NEGATIVE

## 2021-06-18 NOTE — Progress Notes (Signed)
ROB- no concerns 

## 2021-06-18 NOTE — Addendum Note (Signed)
Addended by: Donnetta Hail on: 06/18/2021 04:20 PM ? ? Modules accepted: Orders ? ?

## 2021-06-18 NOTE — Progress Notes (Signed)
?  Subjective  ?Fetal Movement? yes ?Contractions? no ?Leaking Fluid? no ?Vaginal Bleeding? no ?PNVs? yes ? ?NVP resolved, feels much better.  ? ?Objective  ?BP 118/70   Wt 176 lb (79.8 kg)   LMP 03/11/2021 (Exact Date)   BMI 27.98 kg/m?  ?General: NAD ?Pulmonary: no increased work of breathing ?Abdomen: gravid, non-tender ?Extremities: no edema ?Psychiatric: mood appropriate, affect full ? ?Assessment  ?36 y.o. G2P1001 at [redacted]w[redacted]d by  12/09/2021, by Ultrasound presenting for routine prenatal visit ? ?Plan  ? ?Problem List Items Addressed This Visit   ? ?  ? Other  ? Multigravida of advanced maternal age in first trimester  ? ?Other Visit Diagnoses   ? ? Encounter for supervision of other normal pregnancy in second trimester    -  Primary  ? [redacted] weeks gestation of pregnancy      ? ?  ? ?Has anat u/s and appt with MFM 07/21/21.  ? ? ?Samantha Rayborn B. Zuhayr Deeney, PA-C ?[facility],  ?06/18/2021  4:12 PM ? ?

## 2021-06-23 ENCOUNTER — Ambulatory Visit: Payer: BC Managed Care – PPO

## 2021-07-21 ENCOUNTER — Ambulatory Visit: Payer: BC Managed Care – PPO | Attending: Obstetrics and Gynecology

## 2021-07-21 ENCOUNTER — Ambulatory Visit (HOSPITAL_BASED_OUTPATIENT_CLINIC_OR_DEPARTMENT_OTHER): Payer: BC Managed Care – PPO | Admitting: Obstetrics and Gynecology

## 2021-07-21 ENCOUNTER — Other Ambulatory Visit: Payer: Self-pay

## 2021-07-21 VITALS — BP 138/63 | HR 67 | Temp 98.1°F | Ht 66.5 in | Wt 179.0 lb

## 2021-07-21 DIAGNOSIS — O99412 Diseases of the circulatory system complicating pregnancy, second trimester: Secondary | ICD-10-CM

## 2021-07-21 DIAGNOSIS — Z3A19 19 weeks gestation of pregnancy: Secondary | ICD-10-CM

## 2021-07-21 DIAGNOSIS — O09521 Supervision of elderly multigravida, first trimester: Secondary | ICD-10-CM | POA: Diagnosis not present

## 2021-07-21 DIAGNOSIS — Z3480 Encounter for supervision of other normal pregnancy, unspecified trimester: Secondary | ICD-10-CM

## 2021-07-21 DIAGNOSIS — O09522 Supervision of elderly multigravida, second trimester: Secondary | ICD-10-CM | POA: Diagnosis not present

## 2021-07-21 DIAGNOSIS — I499 Cardiac arrhythmia, unspecified: Secondary | ICD-10-CM | POA: Diagnosis not present

## 2021-07-21 DIAGNOSIS — Z363 Encounter for antenatal screening for malformations: Secondary | ICD-10-CM | POA: Diagnosis present

## 2021-07-21 DIAGNOSIS — Z3689 Encounter for other specified antenatal screening: Secondary | ICD-10-CM

## 2021-07-21 NOTE — Progress Notes (Unsigned)
Maternal-Fetal Medicine  ? ?Name: Samantha Kim ?DOB: 01/27/1986 ?MRN: [id number] ?Referring Provider: Alicia Copeland, PA-C ? ?I had the pleasure of seeing Samantha Kim today at [facility].  She was accompanied by her husband. She is G2 P1001 at 19w 6d gestation and is here for ultrasound and consultation. ?Advanced maternal age.  On cell-free fetal DNA screening, the risks of fetal aneuploidies are not increased. ? ?Past medical history significant for premature ventricular contractions (PVC) and the patient is on metoprolol succinate 25 mg daily.  She reports her PVCs have reduced in frequency with treatment.  She does not have any other cardiovascular symptoms. ?No history of diabetes or hypertension or any other chronic medical conditions.  She has mild intermittent asthma that responds to albuterol. ? ?Past surgical history: Nil of note. ?Medications: Prenatal vitamins, metoprolol, albuterol as needed, loratadine as needed. ?Allergies: Allegra (bronchospasm). ?Social history: Denies tobacco or drug or alcohol use.  She has been married 13 years and her husband is in good health. ?Obstetric history significant for a term vaginal delivery in 2015 of a female infant weighing 7 pounds 11 ounces at birth.  Her pregnancy and delivery were uncomplicated. ?GYN history: No history of abnormal Pap smears or cervical surgeries. ? ?Ultrasound ?We performed a fetal anatomical survey.  Amniotic fluid is normal and good fetal activity seen.  No markers of aneuploidies or fetal structural defects are seen.  Fetal biometry is consistent with the previously established dates. ?Advanced maternal age ?Advanced maternal age is associated with increased risk of chromosomal anomalies.  I discussed the significance and limitations of cell free fetal DNA screening.  I explained that only amniocentesis will give a definitive result on the fetal karyotype.  Cell free fetal DNA screening has a greater detection for Down  syndrome. ?Advanced maternal age (>40 years) is associated with a slight increase incidence of stillbirth.  We recommend weekly BPP from [redacted] weeks gestation till delivery. ? ?Metoprolol ?It is not associated with increased risk for fetal congenital malformations.  A small increase in fetal growth restriction is expected in some cases.  I reassured her that it is safe to continue in pregnancy. ? ?Recommendations ?-An appointment was made for her to return in 5 weeks for completion of fetal anatomy. ?-Fetal growth assessment at 32 and 36 weeks. ?-Weekly BPP from [redacted] weeks gestation till delivery. ?-Consider delivery at [redacted] weeks gestation. ?Thank you for consultation.  If you have any questions or concerns, please contact me the [facility].  Consultation including face-to-face counseling (more than 50% of time spent) is 30 minutes. ? ? ? ? ? ? ? ?

## 2021-07-21 NOTE — Progress Notes (Signed)
Maternal-Fetal Medicine  ? ?Name: Envy Lust ?DOB: 1985/12/24 ?MRN: [id number] ?Referring Provider: Fraser Din, PA-C ? ?I had the pleasure of seeing Ms. Renda today at [facility], [facility].  She was accompanied by her husband. She is G2 P1001 at 19w 6d gestation and is here for ultrasound and consultation. ?Advanced maternal age.  On cell-free fetal DNA screening, the risks of fetal aneuploidies are not increased. ? ?Past medical history significant for premature ventricular contractions (PVC) and the patient is on metoprolol succinate 25 mg daily.  She reports her PVCs have reduced in frequency with treatment.  She does not have any other cardiovascular symptoms. ?No history of diabetes or hypertension or any other chronic medical conditions.  She has mild intermittent asthma that responds to albuterol. ? ?Past surgical history: Nil of note. ?Medications: Prenatal vitamins, metoprolol, albuterol as needed, loratadine as needed. ?Allergies: Allegra (bronchospasm). ?Social history: Denies tobacco or drug or alcohol use.  She has been married 13 years and her husband is in good health. ?Obstetric history significant for a term vaginal delivery in 2015 of a female infant weighing 7 pounds 11 ounces at birth.  Her pregnancy and delivery were uncomplicated. ?GYN history: No history of abnormal Pap smears or cervical surgeries. ? ?Ultrasound ?We performed a fetal anatomical survey.  Amniotic fluid is normal and good fetal activity seen.  No markers of aneuploidies or fetal structural defects are seen.  Fetal biometry is consistent with the previously established dates. ?Advanced maternal age ?Advanced maternal age is associated with increased risk of chromosomal anomalies.  I discussed the significance and limitations of cell free fetal DNA screening.  I explained that only amniocentesis will give a definitive result on the fetal karyotype.  Cell free fetal DNA screening has a greater detection for Down  syndrome. ?Advanced maternal age (>40 years) is associated with a slight increase incidence of stillbirth.  We recommend weekly BPP from [redacted] weeks gestation till delivery. ? ?Metoprolol ?It is not associated with increased risk for fetal congenital malformations.  A small increase in fetal growth restriction is expected in some cases.  I reassured her that it is safe to continue in pregnancy. ? ?Recommendations ?-An appointment was made for her to return in 5 weeks for completion of fetal anatomy. ?-Fetal growth assessment at 32 and 36 weeks. ?-Weekly BPP from [redacted] weeks gestation till delivery. ?-Consider delivery at [redacted] weeks gestation. ?Thank you for consultation.  If you have any questions or concerns, please contact me the [facility].  Consultation including face-to-face counseling (more than 50% of time spent) is 30 minutes. ? ? ? ? ? ? ? ?

## 2021-07-22 ENCOUNTER — Encounter: Payer: BC Managed Care – PPO | Admitting: Obstetrics

## 2021-07-23 ENCOUNTER — Ambulatory Visit (INDEPENDENT_AMBULATORY_CARE_PROVIDER_SITE_OTHER): Payer: BC Managed Care – PPO | Admitting: Advanced Practice Midwife

## 2021-07-23 ENCOUNTER — Encounter: Payer: BC Managed Care – PPO | Admitting: Obstetrics

## 2021-07-23 ENCOUNTER — Encounter: Payer: Self-pay | Admitting: Advanced Practice Midwife

## 2021-07-23 VITALS — BP 120/62 | Wt 181.0 lb

## 2021-07-23 DIAGNOSIS — Z3482 Encounter for supervision of other normal pregnancy, second trimester: Secondary | ICD-10-CM

## 2021-07-23 DIAGNOSIS — Z3A20 20 weeks gestation of pregnancy: Secondary | ICD-10-CM

## 2021-07-23 NOTE — Patient Instructions (Signed)
Factor V Leiden ?Factor V Leiden (FVL) is a gene defect, or mutation, that increases your risk for abnormal blood clotting (thrombophilia). The factor five, also called F5 or Factor V gene, controls a protein that helps form blood clots. The protein is called coagulation factor V. Blood clots help to stop bleeding. Normally, another protein called activated protein c, or APC,stops coagulation factor V from making too large a clot. If you have FVL, the APC works too slowly in stopping coagulation factor V from making the clot. As a result, the clot may become too large. ?This blood disorder is passed down through families. If you inherit the gene from one parent, you will have a milder form of FVL and less risk for thrombophilia. If you inherit the gene from both parents, you will have a higher risk for thrombophilia. ?What are the causes? ?This condition is caused by an abnormal gene that is passed down through families (inherited). Most people with FVL do not develop abnormal blood clots. However, you may be at higher risk if you have FVL with other risk factors. These include: ?Taking birth control pills or estrogen replacement. ?Being pregnant. ?Having surgery or a bone fracture. ?Having cancer. ?Being overweight. ?Other causes may include: ?Smoking. ?Doing very little activity or exercise. ?Being on bed rest for a long time. ?Going on a long trip, such as a car or airplane, without moving your legs. ?What are the signs or symptoms? ?This condition has no signs or symptoms unless you form an abnormal blood clot. There are two common types of clots. One type forms in a deep vein of your leg. This is called deep vein thrombosis or DVT. Another type occurs when you have DVT and the clot breaks free and travels to your lung. This is called pulmonary embolism or PE. ?Signs and symptoms of DVT in your leg include: ?Pain. ?Tenderness. ?Swelling. ?Redness. ?Warmth. ?Signs and symptoms of PE include: ?Difficulty  breathing. ?Rapid breathing and pounding heartbeat. ?Chest pain. ?Cough. You may cough up blood or bloody mucus. ?How is this diagnosed? ?Your health care provider may suspect FVL if you have had a DVT or PE, or if you have a family history of FVL, DVT, or PE. Blood tests can be done to confirm the diagnosis. Blood tests include: ?Tests to measure your blood clotting response to APC. ?Tests to check for the FVL gene mutation. ?How is this treated? ?In most cases, treatment is not necessary. You may only need to make lifestyle changes to lower your risk for blood clots. These changes may include quitting smoking, losing weight, and getting regular physical activity. ?In some cases, you may be treated with a blood thinner (anticoagulant) to prevent or reduce blood clotting. This is done if: ?You have had a DVT or PE. ?You have FVL and other risks for blood clots. ?You are at higher risk for DVT or PE due to another condition, like cancer, surgery, pregnancy, or prolonged hospital stay. ?Your health care provider will determine how long you should take the anticoagulant. ?Follow these instructions at home: ?Medicines ? ?Take over-the-counter and prescription medicines only as told by your health care provider. ?If you are taking blood thinners: ?Talk with your health care provider before you take any medicines that contain aspirin or non-steroidal anti-inflammatory drugs (NSAIDs), such as ibuprofen. These medicines increase your risk for dangerous bleeding. ?Take your medicine exactly as told, at the same time every day. ?Avoid activities that could cause injury or bruising. Follow instructions about  how to prevent falls. ?Wear a medical alert bracelet or carry a card that lists what medicines you take. ?Activity ? ?Be physically active. Try to get regular exercise such as walking, swimming, or biking. ?If you go on a long trip, stop every few hours or get up and walk every few hours to keep blood flowing through the  veins in your legs. ?Return to your normal activities as told by your health care provider. Ask your health care provider what activities are safe for you. ?General instructions ?Let all your health care providers, including dentists, know you have FVL. ?Do not use any products that contain nicotine or tobacco. These products include cigarettes, chewing tobacco, and vaping devices, such as e-cigarettes. If you need help quitting, ask your health care provider. ?Wear compression stockings as told by your health care provider. These stockings help to prevent blood clots and reduce swelling in your legs. ?Let your health care provider know you have FVL before starting any birth control or estrogen replacement medication. ?If you are overweight, work with your health care provider and a dietitian to set a weight-loss goal that is healthy and reasonable for you. ?Keep all follow-up visits. This is important. ?Where to find more information ?American Factor V Leiden Association: FilmFranchise.dk ?National Blood Clot Alliance: stoptheclot.org ?Get help right away if: ?You have signs and symptoms of DVT in your leg: ?Pain ?Tenderness. ?Swelling. ?Redness. ?Warmth. ?You have signs and symptoms of pulmonary embolism such as: ?Difficulty breathing. ?Rapid breathing and pounding heartbeat. ?Chest pain. ?Cough. You may cough up bloody mucus or blood. ?These symptoms may be an emergency. Get help right away. Call 911. ?Do not wait to see if the symptoms will go away. ?Do not drive yourself to the hospital. ?Summary ?Factor V Leiden (FVL) is a gene mutation that increases your risk for abnormal blood clotting (thrombophilia). ?This condition has no signs or symptoms unless you form an abnormal blood clot. ?Your health care provider may suspect FVL if you have had DVT, PE, or unexplained blood clotting problems, or if you have a family history of these conditions. ?You may be treated with a blood thinner to prevent or reduce blood  clotting if you have had a deep vein thrombosis or pulmonary embolism, or if you have FVL and other risks for blood clots. ?This information is not intended to replace advice given to you by your health care provider. Make sure you discuss any questions you have with your health care provider. ?Document Revised: 10/14/2020 Document Reviewed: 10/14/2020 ?Elsevier Patient Education ? 2022 Elsevier Inc. ? ?

## 2021-07-23 NOTE — Progress Notes (Signed)
Routine Prenatal Care Visit ? ?Subjective  ?Samantha Kim is a 36 y.o. G2P1001 at [redacted]w[redacted]d being seen today for ongoing prenatal care.  She is currently monitored for the following issues for this low-risk pregnancy and has Mild intermittent asthma without complication; Vitamin D deficiency; Family history of breast cancer; Family history of ovarian cancer; Increased risk of breast cancer; Supervision of other normal pregnancy, antepartum; Overweight (BMI 25.0-29.9); and Multigravida of advanced maternal age on their problem list.  ?----------------------------------------------------------------------------------- ?Patient reports she is doing well. She mentions a family history of Factor V Leiden in maternal cousin and maternal grandparents. She denies history of bleeing disorder and declines testing today. She will plan to mention this at her follow up anatomy MFM appointment in a few weeks.Marland Kitchen   ?Contractions: Not present. Vag. Bleeding: None.  Movement: Present. Leaking Fluid denies.  ?----------------------------------------------------------------------------------- ?The following portions of the patient's history were reviewed and updated as appropriate: allergies, current medications, past family history, past medical history, past social history, past surgical history and problem list. Problem list updated. ? ?Objective  ?Blood pressure 120/62, weight 181 lb (82.1 kg), last menstrual period 03/11/2021. ?Pregravid weight 178 lb (80.7 kg) Total Weight Gain 3 lb (1.361 kg) ?Urinalysis: Urine Protein    Urine Glucose   ? ?Fetal Status: Fetal Heart Rate (bpm): 145 Fundal Height: 20 cm Movement: Present    ? ?General:  Alert, oriented and cooperative. Patient is in no acute distress.  ?Skin: Skin is warm and dry. No rash noted.   ?Cardiovascular: Normal heart rate noted  ?Respiratory: Normal respiratory effort, no problems with respiration noted  ?Abdomen: Soft, gravid, appropriate for gestational age.  Pain/Pressure: Absent     ?Pelvic:  Cervical exam deferred        ?Extremities: Normal range of motion.  Edema: None  ?Mental Status: Normal mood and affect. Normal behavior. Normal judgment and thought content.  ? ?Assessment  ? ?36 y.o. G2P1001 at [redacted]w[redacted]d by  12/09/2021, by Ultrasound presenting for routine prenatal visit ? ?Plan  ? ?pregnancy Problems (from 04/27/21 to present)   ? Problem Noted Resolved  ? Multigravida of advanced maternal age 68/06/2021 by Gae Dry, MD No  ? Supervision of other normal pregnancy, antepartum 04/27/2021 by Imagene Riches, CNM No  ? Overview Addendum 05/22/2021  8:23 AM by Gae Dry, MD  ?   ?Nursing Staff Provider  ?Office Location  [facility] Dating  By [place] at 11 weeks  ?Language  English Anatomy US    ?Flu Vaccine   Genetic Screen  NIPS:   ?TDaP vaccine    Hgb A1C or  ?GTT Third trimester :   ?Covid    LAB RESULTS   ?Rhogam   Blood Type     ?Feeding Plan  Antibody    ?Contraception  Rubella    ?Circumcision  RPR     ?Pediatrician   HBsAg     ?Support Person  HIV    ?Prenatal Classes  Varicella   ?  GBS  (For PCN allergy, check sensitivities)   ?BTL Consent     ?VBAC Consent n/a Pap  2021; needs postpartum  ?  Hgb Electro    ?  CF   ?   SMA   ?     ? ? ?  ?  ?  ?  ? ?Preterm labor symptoms and general obstetric precautions including but not limited to vaginal bleeding, contractions, leaking of fluid and fetal movement were reviewed in detail with the  patient. ?Please refer to After Visit Summary for other counseling recommendations.  ? ?Return in about 4 weeks (around 08/20/2021) for rob. ? ?Rod Can, CNM ?07/23/2021 3:42 PM   ? ?

## 2021-08-20 ENCOUNTER — Other Ambulatory Visit: Payer: Self-pay

## 2021-08-20 ENCOUNTER — Ambulatory Visit (INDEPENDENT_AMBULATORY_CARE_PROVIDER_SITE_OTHER): Payer: BC Managed Care – PPO | Admitting: Advanced Practice Midwife

## 2021-08-20 ENCOUNTER — Encounter: Payer: BC Managed Care – PPO | Admitting: Advanced Practice Midwife

## 2021-08-20 VITALS — BP 120/80 | Wt 183.0 lb

## 2021-08-20 DIAGNOSIS — Z3482 Encounter for supervision of other normal pregnancy, second trimester: Secondary | ICD-10-CM

## 2021-08-20 DIAGNOSIS — Z113 Encounter for screening for infections with a predominantly sexual mode of transmission: Secondary | ICD-10-CM

## 2021-08-20 DIAGNOSIS — Z130 Encounter for screening for diseases of the blood and blood-forming organs and certain disorders involving the immune mechanism: Secondary | ICD-10-CM

## 2021-08-20 DIAGNOSIS — Z3A24 24 weeks gestation of pregnancy: Secondary | ICD-10-CM

## 2021-08-20 DIAGNOSIS — I499 Cardiac arrhythmia, unspecified: Secondary | ICD-10-CM

## 2021-08-20 DIAGNOSIS — O09522 Supervision of elderly multigravida, second trimester: Secondary | ICD-10-CM

## 2021-08-20 DIAGNOSIS — E559 Vitamin D deficiency, unspecified: Secondary | ICD-10-CM

## 2021-08-20 DIAGNOSIS — Z369 Encounter for antenatal screening, unspecified: Secondary | ICD-10-CM

## 2021-08-20 DIAGNOSIS — F419 Anxiety disorder, unspecified: Secondary | ICD-10-CM

## 2021-08-20 DIAGNOSIS — Z131 Encounter for screening for diabetes mellitus: Secondary | ICD-10-CM

## 2021-08-20 LAB — POCT URINALYSIS DIPSTICK OB
Glucose, UA: NEGATIVE
POC,PROTEIN,UA: NEGATIVE

## 2021-08-20 NOTE — Progress Notes (Signed)
Routine Prenatal Care Visit ? ?Subjective  ?Samantha Kim is a 36 y.o. G2P1001 at [redacted]w[redacted]d being seen today for ongoing prenatal care.  She is currently monitored for the following issues for this low-risk pregnancy and has Mild intermittent asthma without complication; Vitamin D deficiency; Family history of breast cancer; Family history of ovarian cancer; Increased risk of breast cancer; Supervision of other normal pregnancy, antepartum; Overweight (BMI 25.0-29.9); and Multigravida of advanced maternal age on their problem list.  ?----------------------------------------------------------------------------------- ?Patient reports no complaints.   ?Contractions: Not present. Vag. Bleeding: None.  Movement: Present. Leaking Fluid denies.  ?----------------------------------------------------------------------------------- ?The following portions of the patient's history were reviewed and updated as appropriate: allergies, current medications, past family history, past medical history, past social history, past surgical history and problem list. Problem list updated. ? ?Objective  ?Blood pressure 120/80, weight 183 lb (83 kg), last menstrual period 03/11/2021. ?Pregravid weight 178 lb (80.7 kg) Total Weight Gain 5 lb (2.268 kg) ?Urinalysis: Urine Protein Negative  Urine Glucose Negative ? ?Fetal Status: Fetal Heart Rate (bpm): 142 Fundal Height: 25 cm Movement: Present    ? ?General:  Alert, oriented and cooperative. Patient is in no acute distress.  ?Skin: Skin is warm and dry. No rash noted.   ?Cardiovascular: Normal heart rate noted  ?Respiratory: Normal respiratory effort, no problems with respiration noted  ?Abdomen: Soft, gravid, appropriate for gestational age. Pain/Pressure: Absent     ?Pelvic:  Cervical exam deferred        ?Extremities: Normal range of motion.  Edema: None  ?Mental Status: Normal mood and affect. Normal behavior. Normal judgment and thought content.  ? ?Assessment  ? ?36 y.o. G2P1001 at  [redacted]w[redacted]d by  12/09/2021, by Ultrasound presenting for routine prenatal visit ? ?Plan  ? ?pregnancy Problems (from 04/27/21 to present)   ? Problem Noted Resolved  ? Multigravida of advanced maternal age 70/06/2021 by Gae Dry, MD No  ? Supervision of other normal pregnancy, antepartum 04/27/2021 by Imagene Riches, CNM No  ? Overview Addendum 05/22/2021  8:23 AM by Gae Dry, MD  ?   ?Nursing Staff Provider  ?Office Location  [facility] Dating  By [place] at 11 weeks  ?Language  English Anatomy US    ?Flu Vaccine   Genetic Screen  NIPS:   ?TDaP vaccine    Hgb A1C or  ?GTT Third trimester :   ?Covid    LAB RESULTS   ?Rhogam   Blood Type     ?Feeding Plan  Antibody    ?Contraception  Rubella    ?Circumcision  RPR     ?Pediatrician   HBsAg     ?Support Person  HIV    ?Prenatal Classes  Varicella   ?  GBS  (For PCN allergy, check sensitivities)   ?BTL Consent     ?VBAC Consent n/a Pap  2021; needs postpartum  ?  Hgb Electro    ?  CF   ?   SMA   ?     ? ? ?  ?  ?  ?  ? ?Preterm labor symptoms and general obstetric precautions including but not limited to vaginal bleeding, contractions, leaking of fluid and fetal movement were reviewed in detail with the patient. ? ? ?Return in about 4 weeks (around 09/17/2021) for 28 wk labs and rob. ? ?Rod Can, CNM ?08/20/2021 3:53 PM   ? ?

## 2021-08-25 ENCOUNTER — Ambulatory Visit: Payer: BC Managed Care – PPO

## 2021-08-25 NOTE — Progress Notes (Deleted)
Pt cancelled her MFM appt @ [facility] for 08/25/21.  Preferred not to reschedule.

## 2021-09-18 ENCOUNTER — Other Ambulatory Visit: Payer: BC Managed Care – PPO

## 2021-09-18 ENCOUNTER — Encounter: Payer: BC Managed Care – PPO | Admitting: Obstetrics

## 2021-09-18 DIAGNOSIS — Z113 Encounter for screening for infections with a predominantly sexual mode of transmission: Secondary | ICD-10-CM

## 2021-09-18 DIAGNOSIS — Z131 Encounter for screening for diabetes mellitus: Secondary | ICD-10-CM

## 2021-09-18 DIAGNOSIS — Z3482 Encounter for supervision of other normal pregnancy, second trimester: Secondary | ICD-10-CM

## 2021-09-18 DIAGNOSIS — Z130 Encounter for screening for diseases of the blood and blood-forming organs and certain disorders involving the immune mechanism: Secondary | ICD-10-CM

## 2021-09-18 DIAGNOSIS — Z369 Encounter for antenatal screening, unspecified: Secondary | ICD-10-CM

## 2021-09-19 LAB — 28 WEEK RH+PANEL
Basophils Absolute: 0.1 10*3/uL (ref 0.0–0.2)
Basos: 0 %
EOS (ABSOLUTE): 0.2 10*3/uL (ref 0.0–0.4)
Eos: 1 %
Gestational Diabetes Screen: 107 mg/dL (ref 70–139)
HIV Screen 4th Generation wRfx: NONREACTIVE
Hematocrit: 37.7 % (ref 34.0–46.6)
Hemoglobin: 12.6 g/dL (ref 11.1–15.9)
Immature Grans (Abs): 0.1 10*3/uL (ref 0.0–0.1)
Immature Granulocytes: 1 %
Lymphocytes Absolute: 1.5 10*3/uL (ref 0.7–3.1)
Lymphs: 13 %
MCH: 30.1 pg (ref 26.6–33.0)
MCHC: 33.4 g/dL (ref 31.5–35.7)
MCV: 90 fL (ref 79–97)
Monocytes Absolute: 0.8 10*3/uL (ref 0.1–0.9)
Monocytes: 7 %
Neutrophils Absolute: 9 10*3/uL — ABNORMAL HIGH (ref 1.4–7.0)
Neutrophils: 78 %
Platelets: 236 10*3/uL (ref 150–450)
RBC: 4.19 x10E6/uL (ref 3.77–5.28)
RDW: 12.1 % (ref 11.7–15.4)
RPR Ser Ql: NONREACTIVE
WBC: 11.6 10*3/uL — ABNORMAL HIGH (ref 3.4–10.8)

## 2021-09-21 ENCOUNTER — Encounter: Payer: Self-pay | Admitting: Licensed Practical Nurse

## 2021-09-21 ENCOUNTER — Ambulatory Visit (INDEPENDENT_AMBULATORY_CARE_PROVIDER_SITE_OTHER): Payer: BC Managed Care – PPO | Admitting: Licensed Practical Nurse

## 2021-09-21 VITALS — BP 110/80 | Wt 188.0 lb

## 2021-09-21 DIAGNOSIS — Z3480 Encounter for supervision of other normal pregnancy, unspecified trimester: Secondary | ICD-10-CM

## 2021-09-21 DIAGNOSIS — Z23 Encounter for immunization: Secondary | ICD-10-CM | POA: Diagnosis not present

## 2021-09-21 DIAGNOSIS — Z3A28 28 weeks gestation of pregnancy: Secondary | ICD-10-CM

## 2021-09-21 LAB — POCT URINALYSIS DIPSTICK OB
Glucose, UA: NEGATIVE
POC,PROTEIN,UA: NEGATIVE

## 2021-09-21 NOTE — Progress Notes (Signed)
Routine Prenatal Care Visit  Subjective  Samantha Kim is a 36 y.o. G2P1001 at [redacted]w[redacted]d being seen today for ongoing prenatal care.  She is currently monitored for the following issues for this low-risk pregnancy and has Mild intermittent asthma without complication; Vitamin D deficiency; Family history of breast cancer; Family history of ovarian cancer; Increased risk of breast cancer; Supervision of other normal pregnancy, antepartum; Overweight (BMI 25.0-29.9); and Multigravida of advanced maternal age on their problem list.  ----------------------------------------------------------------------------------- Patient reports no complaints.  Doing well. Is worried about PP anxiety, would like to start Zoloft prior to birth-feels fine now but also states she can feel it "creeping in" -Discussed MFM recommendations for follow up, pt prefers not to follow recommendations as she is not concerned, discussed these recommendations are typically made for pregnant people >40.  -had some sciatic pain, works with a PT that gave her some stretches-feeling better now -[place] first child for over 1 year, plans to do the same, needs breast pump  Contractions: Not present. Vag. Bleeding: None.  Movement: Present. Leaking Fluid denies.  ----------------------------------------------------------------------------------- The following portions of the patient's history were reviewed and updated as appropriate: allergies, current medications, past family history, past medical history, past social history, past surgical history and problem list. Problem list updated.  Objective  Blood pressure 110/80, weight 188 lb (85.3 kg), last menstrual period 03/11/2021. Pregravid weight 178 lb (80.7 kg) Total Weight Gain 10 lb (4.536 kg) Urinalysis: Urine Protein Negative  Urine Glucose Negative  Fetal Status: Fetal Heart Rate (bpm): 145 Fundal Height: 27 cm Movement: Present     General:  Alert, oriented and cooperative.  Patient is in no acute distress.  Skin: Skin is warm and dry. No rash noted.   Cardiovascular: Normal heart rate noted  Respiratory: Normal respiratory effort, no problems with respiration noted  Abdomen: Soft, gravid, appropriate for gestational age. Pain/Pressure: Absent     Pelvic:  Cervical exam deferred        Extremities: Normal range of motion.  Edema: None  Mental Status: Normal mood and affect. Normal behavior. Normal judgment and thought content.   Assessment   36 y.o. G2P1001 at [redacted]w[redacted]d by  12/09/2021, by Ultrasound presenting for routine prenatal visit  Plan   pregnancy Problems (from 04/27/21 to present)     Problem Noted Resolved   Multigravida of advanced maternal age 47/06/2021 by Gae Dry, MD No   Supervision of other normal pregnancy, antepartum 04/27/2021 by Imagene Riches, CNM No   Overview Addendum 09/21/2021  3:12 PM by Allen Derry, [place] Staff Provider  Office Location  [facility] Dating  By [place] at 11 weeks  Language  English Anatomy US    Flu Vaccine   Genetic Screen  NIPS:   TDaP vaccine    Hgb A1C or  GTT Third trimester :   Covid    LAB RESULTS   Rhogam   Blood Type A/Positive/-- (02/03 0847)   Feeding Plan  Antibody Negative (02/03 0847)  Contraception  Rubella <0.90 (02/03 0847)  Circumcision  RPR Non Reactive (06/02 0916)   Pediatrician   HBsAg Negative (02/03 0847)   Support Person  HIV Non Reactive (06/02 0916)  Prenatal Classes  Varicella Non immune    GBS  (For PCN allergy, check sensitivities)   BTL Consent     VBAC Consent n/a Pap  2021; needs postpartum    Hgb Electro      CF  SMA                    Preterm labor symptoms and general obstetric precautions including but not limited to vaginal bleeding, contractions, leaking of fluid and fetal movement were reviewed in detail with the patient. Please refer to After Visit Summary for other counseling recommendations.   Return in about 2 weeks (around  10/05/2021) for [place].  Roberto Scales, CNM  [facility]on, [facility]  09/21/21  5:16 PM

## 2021-09-28 ENCOUNTER — Other Ambulatory Visit: Payer: BC Managed Care – PPO

## 2021-09-28 ENCOUNTER — Encounter: Payer: BC Managed Care – PPO | Admitting: Obstetrics

## 2021-10-06 ENCOUNTER — Ambulatory Visit (INDEPENDENT_AMBULATORY_CARE_PROVIDER_SITE_OTHER): Payer: BC Managed Care – PPO | Admitting: Family Medicine

## 2021-10-06 VITALS — BP 122/78 | Wt 187.0 lb

## 2021-10-06 DIAGNOSIS — Z3480 Encounter for supervision of other normal pregnancy, unspecified trimester: Secondary | ICD-10-CM

## 2021-10-06 DIAGNOSIS — O09523 Supervision of elderly multigravida, third trimester: Secondary | ICD-10-CM

## 2021-10-06 DIAGNOSIS — Z3A30 30 weeks gestation of pregnancy: Secondary | ICD-10-CM

## 2021-10-06 LAB — POCT URINALYSIS DIPSTICK OB
Glucose, UA: NEGATIVE
POC,PROTEIN,UA: NEGATIVE

## 2021-10-06 NOTE — Progress Notes (Signed)
   PRENATAL VISIT NOTE  Subjective:  Samantha Kim is a 36 y.o. G2P1001 at [redacted]w[redacted]d being seen today for ongoing prenatal care.  She is currently monitored for the following issues for this low-risk pregnancy and has Mild intermittent asthma without complication; Vitamin D deficiency; Family history of breast cancer; Family history of ovarian cancer; Increased risk of breast cancer; Supervision of other normal pregnancy, antepartum; Overweight (BMI 25.0-29.9); and Multigravida of advanced maternal age on their problem list.  Patient reports no complaints.  Contractions: Not present. Vag. Bleeding: None.  Movement: Present. Denies leaking of fluid.   The following portions of the patient's history were reviewed and updated as appropriate: allergies, current medications, past family history, past medical history, past social history, past surgical history and problem list.   Objective:   Vitals:   10/06/21 1616  BP: 122/78  Weight: 187 lb (84.8 kg)    Fetal Status: Fetal Heart Rate (bpm): 130 Fundal Height: 30 cm Movement: Present     General:  Alert, oriented and cooperative. Patient is in no acute distress.  Skin: Skin is warm and dry. No rash noted.   Cardiovascular: Normal heart rate noted  Respiratory: Normal respiratory effort, no problems with respiration noted  Abdomen: Soft, gravid, appropriate for gestational age.  Pain/Pressure: Present     Pelvic: Cervical exam deferred        Extremities: Normal range of motion.  Edema: Trace  Mental Status: Normal mood and affect. Normal behavior. Normal judgment and thought content.   Assessment and Plan:  Pregnancy: G2P1001 at [redacted]w[redacted]d 1. Supervision of other normal pregnancy, antepartum Up to date USing compression stalking to help with foot swelling No complaints Has ordered her breast pump - POC Urinalysis Dipstick OB  2. [redacted] weeks gestation of pregnancy - POC Urinalysis Dipstick OB  3. Multigravida of advanced maternal age in  third trimester   Preterm labor symptoms and general obstetric precautions including but not limited to vaginal bleeding, contractions, leaking of fluid and fetal movement were reviewed in detail with the patient. Please refer to After Visit Summary for other counseling recommendations.   Return in about 2 weeks (around 10/20/2021) for Routine prenatal care.  Future Appointments  Date Time Provider Department Center  10/19/2021  3:55 PM Tresea Mall, CNM [facility] None    Federico Flake, MD

## 2021-10-19 ENCOUNTER — Encounter: Payer: Self-pay | Admitting: Advanced Practice Midwife

## 2021-10-19 ENCOUNTER — Ambulatory Visit (INDEPENDENT_AMBULATORY_CARE_PROVIDER_SITE_OTHER): Payer: BC Managed Care – PPO | Admitting: Advanced Practice Midwife

## 2021-10-19 VITALS — BP 118/80 | Wt 189.0 lb

## 2021-10-19 DIAGNOSIS — Z3A32 32 weeks gestation of pregnancy: Secondary | ICD-10-CM

## 2021-10-19 DIAGNOSIS — Z3483 Encounter for supervision of other normal pregnancy, third trimester: Secondary | ICD-10-CM

## 2021-10-19 LAB — POCT URINALYSIS DIPSTICK
Glucose, UA: NEGATIVE
Protein, UA: NEGATIVE

## 2021-10-19 NOTE — Progress Notes (Signed)
No vb. No lof.  

## 2021-10-19 NOTE — Progress Notes (Signed)
Routine Prenatal Care Visit  Subjective  Samantha Kim is a 36 y.o. G2P1001 at [redacted]w[redacted]d being seen today for ongoing prenatal care.  She is currently monitored for the following issues for this low-risk pregnancy and has Mild intermittent asthma without complication; Vitamin D deficiency; Family history of breast cancer; Family history of ovarian cancer; Increased risk of breast cancer; Supervision of other normal pregnancy, antepartum; Overweight (BMI 25.0-29.9); and Multigravida of advanced maternal age on their problem list.  ----------------------------------------------------------------------------------- Patient reports no complaints.   Contractions: Not present. Vag. Bleeding: None.  Movement: Present. Leaking Fluid denies.  ----------------------------------------------------------------------------------- The following portions of the patient's history were reviewed and updated as appropriate: allergies, current medications, past family history, past medical history, past social history, past surgical history and problem list. Problem list updated.  Objective  Blood pressure 118/80, weight 189 lb (85.7 kg), last menstrual period 03/11/2021. Pregravid weight 178 lb (80.7 kg) Total Weight Gain 11 lb (4.99 kg) Urinalysis: Urine Protein    Urine Glucose    Fetal Status: Fetal Heart Rate (bpm): 137 Fundal Height: 32 cm Movement: Present     General:  Alert, oriented and cooperative. Patient is in no acute distress.  Skin: Skin is warm and dry. No rash noted.   Cardiovascular: Normal heart rate noted  Respiratory: Normal respiratory effort, no problems with respiration noted  Abdomen: Soft, gravid, appropriate for gestational age. Pain/Pressure: Absent     Pelvic:  Cervical exam deferred        Extremities: Normal range of motion.  Edema: None  Mental Status: Normal mood and affect. Normal behavior. Normal judgment and thought content.   Assessment   36 y.o. G2P1001 at [redacted]w[redacted]d by   12/09/2021, by Ultrasound presenting for routine prenatal visit  Plan   pregnancy Problems (from 04/27/21 to present)    Problem Noted Resolved   Multigravida of advanced maternal age 67/06/2021 by Nadara Mustard, MD No   Supervision of other normal pregnancy, antepartum 04/27/2021 by Mirna Mires, CNM No   Overview Addendum 09/21/2021  5:17 PM by Ellwood Sayers, CNM     Nursing Staff Provider  Office Location  [facility] Dating  By [place] at 11 weeks  Language  English Anatomy US    Flu Vaccine   Genetic Screen  NIPS:   TDaP vaccine   09/21/2021 Hgb A1C or  GTT Third trimester : 107  Covid    LAB RESULTS   Rhogam  NA  Blood Type A/Positive/-- (02/03 0847)   Feeding Plan Breast Antibody Negative (02/03 0847)  Contraception Vasectomy  Rubella <0.90 (02/03 0847)  Circumcision  RPR Non Reactive (06/02 0916)   Pediatrician  [place]  HBsAg Negative (02/03 0847)   Support Person  HIV Non Reactive (06/02 0916)  Prenatal Classes multip  Varicella Non immune    GBS  (For PCN allergy, check sensitivities)   BTL Consent     VBAC Consent n/a Pap  2021; needs postpartum    Hgb Electro      CF      SMA                   Preterm labor symptoms and general obstetric precautions including but not limited to vaginal bleeding, contractions, leaking of fluid and fetal movement were reviewed in detail with the patient. Please refer to After Visit Summary for other counseling recommendations.   Return in about 2 weeks (around 11/02/2021) for rob.  Tresea Mall, CNM 10/19/2021 4:05 PM

## 2021-11-03 ENCOUNTER — Ambulatory Visit (INDEPENDENT_AMBULATORY_CARE_PROVIDER_SITE_OTHER): Payer: BC Managed Care – PPO | Admitting: Family Medicine

## 2021-11-03 ENCOUNTER — Encounter: Payer: BC Managed Care – PPO | Admitting: Family Medicine

## 2021-11-03 VITALS — BP 118/78 | Wt 190.0 lb

## 2021-11-03 DIAGNOSIS — O99891 Other specified diseases and conditions complicating pregnancy: Secondary | ICD-10-CM

## 2021-11-03 DIAGNOSIS — Z8659 Personal history of other mental and behavioral disorders: Secondary | ICD-10-CM

## 2021-11-03 DIAGNOSIS — Z3A34 34 weeks gestation of pregnancy: Secondary | ICD-10-CM

## 2021-11-03 DIAGNOSIS — O09523 Supervision of elderly multigravida, third trimester: Secondary | ICD-10-CM

## 2021-11-03 DIAGNOSIS — Z3480 Encounter for supervision of other normal pregnancy, unspecified trimester: Secondary | ICD-10-CM

## 2021-11-03 LAB — POCT URINALYSIS DIPSTICK OB
Glucose, UA: NEGATIVE
POC,PROTEIN,UA: NEGATIVE

## 2021-11-03 MED ORDER — SERTRALINE HCL 25 MG PO TABS: 25.0000 mg | ORAL_TABLET | Freq: Every day | ORAL | 12 refills | Status: AC

## 2021-11-03 NOTE — Progress Notes (Signed)
    PRENATAL VISIT NOTE  Subjective:  Samantha Kim is a 36 y.o. G2P1001 at [redacted]w[redacted]d being seen today for ongoing prenatal care.  She is currently monitored for the following issues for this high-risk pregnancy and has Mild intermittent asthma without complication; Vitamin D deficiency; Family history of breast cancer; Family history of ovarian cancer; Increased risk of breast cancer; Supervision of other normal pregnancy, antepartum; Overweight (BMI 25.0-29.9); Multigravida of advanced maternal age; and History of postpartum depression, currently pregnant on their problem list.  Patient reports no complaints.  Contractions: Not present. Vag. Bleeding: None.  Movement: Present. Denies leaking of fluid.   The following portions of the patient's history were reviewed and updated as appropriate: allergies, current medications, past family history, past medical history, past social history, past surgical history and problem list.   Objective:   Vitals:   11/03/21 1539  BP: 118/78  Weight: 190 lb (86.2 kg)    Fetal Status: Fetal Heart Rate (bpm): 135 Fundal Height: 34 cm Movement: Present     General:  Alert, oriented and cooperative. Patient is in no acute distress.  Skin: Skin is warm and dry. No rash noted.   Cardiovascular: Normal heart rate noted  Respiratory: Normal respiratory effort, no problems with respiration noted  Abdomen: Soft, gravid, appropriate for gestational age.  Pain/Pressure: Absent     Pelvic: Cervical exam deferred        Extremities: Normal range of motion.  Edema: Trace  Mental Status: Normal mood and affect. Normal behavior. Normal judgment and thought content.   Assessment and Plan:  Pregnancy: G2P1001 at [redacted]w[redacted]d 1. Supervision of other normal pregnancy, antepartum TWG=12 lb (5.443 kg) which is at goal Reports feeling well Reviewed swabs next visit Partner plans on vasectomy They plan to circumsize infant Will start zoloft between now and next appt to help  with PPA/PPD  - POC Urinalysis Dipstick OB  2. [redacted] weeks gestation of pregnancy - POC Urinalysis Dipstick OB  Preterm labor symptoms and general obstetric precautions including but not limited to vaginal bleeding, contractions, leaking of fluid and fetal movement were reviewed in detail with the patient. Please refer to After Visit Summary for other counseling recommendations.   Return in about 2 weeks (around 11/17/2021) for Routine prenatal care, 36wks.  Future Appointments  Date Time Provider Department Center  11/17/2021  1:55 PM Tresea Mall, CNM [facility] None     Federico Flake, MD

## 2021-11-17 ENCOUNTER — Other Ambulatory Visit (HOSPITAL_COMMUNITY)
Admission: RE | Admit: 2021-11-17 | Discharge: 2021-11-17 | Disposition: A | Discharge status: Home / Self Care | Payer: BC Managed Care – PPO | Source: Ambulatory Visit | Attending: Advanced Practice Midwife | Admitting: Advanced Practice Midwife

## 2021-11-17 ENCOUNTER — Ambulatory Visit (INDEPENDENT_AMBULATORY_CARE_PROVIDER_SITE_OTHER): Payer: BC Managed Care – PPO | Admitting: Advanced Practice Midwife

## 2021-11-17 ENCOUNTER — Encounter: Payer: Self-pay | Admitting: Advanced Practice Midwife

## 2021-11-17 VITALS — BP 128/80 | Wt 187.0 lb

## 2021-11-17 DIAGNOSIS — Z113 Encounter for screening for infections with a predominantly sexual mode of transmission: Secondary | ICD-10-CM

## 2021-11-17 DIAGNOSIS — Z369 Encounter for antenatal screening, unspecified: Secondary | ICD-10-CM

## 2021-11-17 DIAGNOSIS — Z3A36 36 weeks gestation of pregnancy: Secondary | ICD-10-CM

## 2021-11-17 DIAGNOSIS — Z3483 Encounter for supervision of other normal pregnancy, third trimester: Secondary | ICD-10-CM

## 2021-11-17 DIAGNOSIS — Z3685 Encounter for antenatal screening for Streptococcus B: Secondary | ICD-10-CM

## 2021-11-17 NOTE — Progress Notes (Signed)
Routine Prenatal Care Visit  Subjective  Samantha Kim is a 36 y.o. G2P1001 at [redacted]w[redacted]d being seen today for ongoing prenatal care.  She is currently monitored for the following issues for this low-risk pregnancy and has Mild intermittent asthma without complication; Vitamin D deficiency; Family history of breast cancer; Family history of ovarian cancer; Increased risk of breast cancer; Supervision of other normal pregnancy, antepartum; Overweight (BMI 25.0-29.9); Multigravida of advanced maternal age; and History of postpartum depression, currently pregnant on their problem list.  ----------------------------------------------------------------------------------- Patient reports no complaints.  Reviewed labor pain relief methods. She didn't have an epidural with first labor. Contractions: Not present. Vag. Bleeding: None.  Movement: Present. Leaking Fluid denies.  ----------------------------------------------------------------------------------- The following portions of the patient's history were reviewed and updated as appropriate: allergies, current medications, past family history, past medical history, past social history, past surgical history and problem list. Problem list updated.  Objective  Blood pressure 128/80, weight 187 lb (84.8 kg), last menstrual period 03/11/2021. Pregravid weight 178 lb (80.7 kg) Total Weight Gain 9 lb (4.082 kg) Urinalysis: Urine Protein    Urine Glucose    Fetal Status: Fetal Heart Rate (bpm): 142 Fundal Height: 36 cm Movement: Present     General:  Alert, oriented and cooperative. Patient is in no acute distress.  Skin: Skin is warm and dry. No rash noted.   Cardiovascular: Normal heart rate noted  Respiratory: Normal respiratory effort, no problems with respiration noted  Abdomen: Soft, gravid, appropriate for gestational age. Pain/Pressure: Present     Pelvic:  GBS/aptima collected        Extremities: Normal range of motion.  Edema: None  Mental  Status: Normal mood and affect. Normal behavior. Normal judgment and thought content.   Assessment   36 y.o. G2P1001 at [redacted]w[redacted]d by  12/09/2021, by Ultrasound presenting for routine prenatal visit  Plan   pregnancy Problems (from 04/27/21 to present)    Problem Noted Resolved   Multigravida of advanced maternal age 79/06/2021 by Nadara Mustard, MD No   Supervision of other normal pregnancy, antepartum 04/27/2021 by Mirna Mires, CNM No   Overview Addendum 11/03/2021  3:58 PM by Federico Flake, MD     Nursing Staff Provider  Office Location  [facility] Dating  By [place] at 11 weeks  Language  English Anatomy US    Flu Vaccine   Genetic Screen  NIPS: LR female  TDaP vaccine   09/21/2021 Hgb A1C or  GTT Third trimester : 107  Covid    LAB RESULTS   Rhogam  NA  Blood Type A/Positive/-- (02/03 0847)   Feeding Plan Breast Antibody Negative (02/03 0847)  Contraception Vasectomy  Rubella <0.90 (02/03 0847)  Circumcision Desired  RPR Non Reactive (06/02 0916)   Pediatrician  [place]  HBsAg Negative (02/03 0847)   Support Person FOB HIV Non Reactive (06/02 0916)  Prenatal Classes multip  Varicella Non immune    GBS  (For PCN allergy, check sensitivities)   BTL Consent     VBAC Consent n/a Pap  2021; needs postpartum    Hgb Electro      CF      SMA                   Preterm labor symptoms and general obstetric precautions including but not limited to vaginal bleeding, contractions, leaking of fluid and fetal movement were reviewed in detail with the patient. Please refer to After Visit Summary for other counseling recommendations.  Return in about 1 week (around 11/24/2021) for rob.  Tresea Mall, CNM 11/17/2021 2:24 PM

## 2021-11-19 LAB — CERVICOVAGINAL ANCILLARY ONLY
Chlamydia: NEGATIVE
Comment: NEGATIVE
Comment: NORMAL
Neisseria Gonorrhea: NEGATIVE

## 2021-11-21 LAB — CULTURE, BETA STREP (GROUP B ONLY): Strep Gp B Culture: NEGATIVE

## 2021-11-26 ENCOUNTER — Ambulatory Visit (INDEPENDENT_AMBULATORY_CARE_PROVIDER_SITE_OTHER): Payer: BC Managed Care – PPO | Admitting: Licensed Practical Nurse

## 2021-11-26 VITALS — BP 128/80 | Wt 188.0 lb

## 2021-11-26 DIAGNOSIS — Z3A38 38 weeks gestation of pregnancy: Secondary | ICD-10-CM

## 2021-11-26 DIAGNOSIS — Z3483 Encounter for supervision of other normal pregnancy, third trimester: Secondary | ICD-10-CM

## 2021-11-26 LAB — POCT URINALYSIS DIPSTICK OB
Glucose, UA: NEGATIVE
POC,PROTEIN,UA: NEGATIVE

## 2021-11-26 NOTE — Progress Notes (Signed)
Routine Prenatal Care Visit  Subjective  Samantha Kim is a 36 y.o. G2P1001 at [redacted]w[redacted]d being seen today for ongoing prenatal care.  She is currently monitored for the following issues for this low-risk pregnancy and has Mild intermittent asthma without complication; Vitamin D deficiency; Family history of breast cancer; Family history of ovarian cancer; Increased risk of breast cancer; Supervision of other normal pregnancy, antepartum; Overweight (BMI 25.0-29.9); Multigravida of advanced maternal age; and History of postpartum depression, currently pregnant on their problem list.  ----------------------------------------------------------------------------------- Patient reports no complaints.  Doing well. Started Zoloft 3 weeks ago, feels it is happening. Ready for baby. Feels baby has entered pelvis  Contractions: Not present. Vag. Bleeding: None.  Movement: Present. Leaking Fluid denies.  ----------------------------------------------------------------------------------- The following portions of the patient's history were reviewed and updated as appropriate: allergies, current medications, past family history, past medical history, past social history, past surgical history and problem list. Problem list updated.  Objective  Blood pressure 128/80, weight 188 lb (85.3 kg), last menstrual period 03/11/2021. Pregravid weight 178 lb (80.7 kg) Total Weight Gain 10 lb (4.536 kg) Urinalysis: Urine Protein Negative  Urine Glucose Negative  Fetal Status: Fetal Heart Rate (bpm): 140 Fundal Height: 36 cm Movement: Present     General:  Alert, oriented and cooperative. Patient is in no acute distress.  Skin: Skin is warm and dry. No rash noted.   Cardiovascular: Normal heart rate noted  Respiratory: Normal respiratory effort, no problems with respiration noted  Abdomen: Soft, gravid, appropriate for gestational age. Pain/Pressure: Present     Pelvic:  Cervical exam deferred        Extremities: Normal  range of motion.  Edema: Trace  Mental Status: Normal mood and affect. Normal behavior. Normal judgment and thought content.   Assessment   36 y.o. G2P1001 at [redacted]w[redacted]d by  12/09/2021, by Ultrasound presenting for routine prenatal visit  Plan   pregnancy Problems (from 04/27/21 to present)     Problem Noted Resolved   Multigravida of advanced maternal age 41/06/2021 by Nadara Mustard, MD No   Supervision of other normal pregnancy, antepartum 04/27/2021 by Mirna Mires, CNM No   Overview Addendum 11/03/2021  3:58 PM by Federico Flake, MD     Nursing Staff Provider  Office Location  [facility] Dating  By [place] at 11 weeks  Language  English Anatomy US    Flu Vaccine   Genetic Screen  NIPS: LR female  TDaP vaccine   09/21/2021 Hgb A1C or  GTT Third trimester : 107  Covid    LAB RESULTS   Rhogam  NA  Blood Type A/Positive/-- (02/03 0847)   Feeding Plan Breast Antibody Negative (02/03 0847)  Contraception Vasectomy  Rubella <0.90 (02/03 0847)  Circumcision Desired  RPR Non Reactive (06/02 0916)   Pediatrician  [place]  HBsAg Negative (02/03 0847)   Support Person FOB HIV Non Reactive (06/02 0916)  Prenatal Classes multip  Varicella Non immune    GBS  (For PCN allergy, check sensitivities)   BTL Consent     VBAC Consent n/a Pap  2021; needs postpartum    Hgb Electro      CF      SMA                    Term labor symptoms and general obstetric precautions including but not limited to vaginal bleeding, contractions, leaking of fluid and fetal movement were reviewed in detail with the patient. Please refer to  After Visit Summary for other counseling recommendations.   Return in about 1 week (around 12/03/2021) for ROB.  Carie Caddy, CNM  Domingo Pulse, [facility]  11/26/21  4:23 PM

## 2021-12-03 ENCOUNTER — Encounter: Payer: Self-pay | Admitting: Obstetrics & Gynecology

## 2021-12-03 ENCOUNTER — Ambulatory Visit (INDEPENDENT_AMBULATORY_CARE_PROVIDER_SITE_OTHER): Payer: BC Managed Care – PPO | Admitting: Obstetrics & Gynecology

## 2021-12-03 VITALS — BP 110/72 | Wt 190.4 lb

## 2021-12-03 DIAGNOSIS — Z3A39 39 weeks gestation of pregnancy: Secondary | ICD-10-CM

## 2021-12-03 DIAGNOSIS — R82998 Other abnormal findings in urine: Secondary | ICD-10-CM

## 2021-12-03 LAB — POCT URINALYSIS DIPSTICK OB
Bilirubin, UA: NEGATIVE
Blood, UA: NEGATIVE
Glucose, UA: NEGATIVE
Nitrite, UA: NEGATIVE
POC,PROTEIN,UA: NEGATIVE
Spec Grav, UA: 1.01 (ref 1.010–1.025)
Urobilinogen, UA: 0.2 E.U./dL
pH, UA: 6.5 (ref 5.0–8.0)

## 2021-12-03 NOTE — Progress Notes (Signed)
36 y/o G2P1001 [facility] 12-09-21 @ 391/[redacted] wks EGA presents for routine care without complaints.

## 2021-12-05 LAB — URINE CULTURE

## 2021-12-08 ENCOUNTER — Ambulatory Visit (INDEPENDENT_AMBULATORY_CARE_PROVIDER_SITE_OTHER): Payer: BC Managed Care – PPO | Admitting: Obstetrics & Gynecology

## 2021-12-08 VITALS — BP 118/74 | Wt 188.0 lb

## 2021-12-08 DIAGNOSIS — E663 Overweight: Secondary | ICD-10-CM

## 2021-12-08 DIAGNOSIS — Z3A39 39 weeks gestation of pregnancy: Secondary | ICD-10-CM

## 2021-12-08 DIAGNOSIS — Z3480 Encounter for supervision of other normal pregnancy, unspecified trimester: Secondary | ICD-10-CM

## 2021-12-08 DIAGNOSIS — O09523 Supervision of elderly multigravida, third trimester: Secondary | ICD-10-CM

## 2021-12-08 NOTE — Progress Notes (Signed)
No vb. No lof. Cervical check

## 2021-12-08 NOTE — Progress Notes (Signed)
   PRENATAL VISIT NOTE  Subjective:  Samantha Kim is a 36 y.o.  married G2P1001 at [redacted]w[redacted]d being seen today for ongoing prenatal care.  She is currently monitored for the following issues for this low-risk pregnancy and has Mild intermittent asthma without complication; Vitamin D deficiency; Family history of breast cancer; Family history of ovarian cancer; Increased risk of breast cancer; Supervision of other normal pregnancy, antepartum; Overweight (BMI 25.0-29.9); Multigravida of advanced maternal age; and History of postpartum depression, currently pregnant on their problem list.  Patient reports no complaints.  Contractions: Not present. Vag. Bleeding: None.  Movement: Present. Denies leaking of fluid.   The following portions of the patient's history were reviewed and updated as appropriate: allergies, current medications, past family history, past medical history, past social history, past surgical history and problem list.   Objective:   Vitals:   12/08/21 1259  BP: 118/74  Weight: 188 lb (85.3 kg)    Fetal Status: Fetal Heart Rate (bpm): 138 Fundal Height: 38 cm Movement: Present  Presentation: Vertex  General:  Alert, oriented and cooperative. Patient is in no acute distress.  Skin: Skin is warm and dry. No rash noted.   Cardiovascular: Normal heart rate noted  Respiratory: Normal respiratory effort, no problems with respiration noted  Abdomen: Soft, gravid, appropriate for gestational age.  Pain/Pressure: Present     Pelvic:  Dilation: 1.5 Effacement (%): 80 Station: -3  Extremities: Normal range of motion.     Mental Status: Normal mood and affect. Normal behavior. Normal judgment and thought content.   Assessment and Plan:  Pregnancy: G2P1001 at [redacted]w[redacted]d 1. Supervision of other normal pregnancy, antepartum   2. Overweight (BMI 25.0-29.9) - excellent weight gain in pregnancy  3. Multigravida of advanced maternal age in third trimester - Husband plans for  vasectomy  4. [redacted] weeks gestation of pregnancy - Labor precautions We discussed sex and nipple stim as means to start kick labor She requested membrane sweep  today, so I did that.  Term labor symptoms and general obstetric precautions including but not limited to vaginal bleeding, contractions, leaking of fluid and fetal movement were reviewed in detail with the patient. Please refer to After Visit Summary for other counseling recommendations.   Return in about 1 week (around 12/15/2021).  No future appointments.  Allie Bossier, MD

## 2021-12-11 ENCOUNTER — Inpatient Hospital Stay
Admission: EM | Admit: 2021-12-11 | Discharge: 2021-12-13 | DRG: 806 | Disposition: A | Discharge status: Home / Self Care | Payer: BC Managed Care – PPO | Attending: Licensed Practical Nurse | Admitting: Licensed Practical Nurse

## 2021-12-11 DIAGNOSIS — J45909 Unspecified asthma, uncomplicated: Secondary | ICD-10-CM | POA: Diagnosis present

## 2021-12-11 DIAGNOSIS — O480 Post-term pregnancy: Principal | ICD-10-CM | POA: Diagnosis present

## 2021-12-11 DIAGNOSIS — O9952 Diseases of the respiratory system complicating childbirth: Secondary | ICD-10-CM | POA: Diagnosis present

## 2021-12-11 DIAGNOSIS — D62 Acute posthemorrhagic anemia: Secondary | ICD-10-CM | POA: Diagnosis not present

## 2021-12-11 DIAGNOSIS — Z23 Encounter for immunization: Secondary | ICD-10-CM

## 2021-12-11 DIAGNOSIS — O4290 Premature rupture of membranes, unspecified as to length of time between rupture and onset of labor, unspecified weeks of gestation: Secondary | ICD-10-CM | POA: Diagnosis present

## 2021-12-11 DIAGNOSIS — Z3A40 40 weeks gestation of pregnancy: Secondary | ICD-10-CM

## 2021-12-11 DIAGNOSIS — O9081 Anemia of the puerperium: Secondary | ICD-10-CM | POA: Diagnosis not present

## 2021-12-12 ENCOUNTER — Other Ambulatory Visit: Payer: Self-pay

## 2021-12-12 ENCOUNTER — Encounter: Payer: Self-pay | Admitting: Obstetrics and Gynecology

## 2021-12-12 DIAGNOSIS — O9952 Diseases of the respiratory system complicating childbirth: Secondary | ICD-10-CM | POA: Diagnosis present

## 2021-12-12 DIAGNOSIS — Z3A40 40 weeks gestation of pregnancy: Secondary | ICD-10-CM

## 2021-12-12 DIAGNOSIS — D62 Acute posthemorrhagic anemia: Secondary | ICD-10-CM | POA: Diagnosis not present

## 2021-12-12 DIAGNOSIS — O09513 Supervision of elderly primigravida, third trimester: Secondary | ICD-10-CM

## 2021-12-12 DIAGNOSIS — O09293 Supervision of pregnancy with other poor reproductive or obstetric history, third trimester: Secondary | ICD-10-CM

## 2021-12-12 DIAGNOSIS — O4202 Full-term premature rupture of membranes, onset of labor within 24 hours of rupture: Secondary | ICD-10-CM

## 2021-12-12 DIAGNOSIS — O99513 Diseases of the respiratory system complicating pregnancy, third trimester: Secondary | ICD-10-CM

## 2021-12-12 DIAGNOSIS — Z23 Encounter for immunization: Secondary | ICD-10-CM | POA: Diagnosis not present

## 2021-12-12 DIAGNOSIS — O9081 Anemia of the puerperium: Secondary | ICD-10-CM | POA: Diagnosis not present

## 2021-12-12 DIAGNOSIS — J45909 Unspecified asthma, uncomplicated: Secondary | ICD-10-CM

## 2021-12-12 DIAGNOSIS — O480 Post-term pregnancy: Secondary | ICD-10-CM | POA: Diagnosis present

## 2021-12-12 DIAGNOSIS — O4290 Premature rupture of membranes, unspecified as to length of time between rupture and onset of labor, unspecified weeks of gestation: Secondary | ICD-10-CM | POA: Diagnosis present

## 2021-12-12 LAB — COMPREHENSIVE METABOLIC PANEL
ALT: 16 U/L (ref 0–44)
AST: 26 U/L (ref 15–41)
Albumin: 3.1 g/dL — ABNORMAL LOW (ref 3.5–5.0)
Alkaline Phosphatase: 181 U/L — ABNORMAL HIGH (ref 38–126)
Anion gap: 7 (ref 5–15)
BUN: 11 mg/dL (ref 6–20)
CO2: 20 mmol/L — ABNORMAL LOW (ref 22–32)
Calcium: 8.9 mg/dL (ref 8.9–10.3)
Chloride: 110 mmol/L (ref 98–111)
Creatinine, Ser: 0.61 mg/dL (ref 0.44–1.00)
GFR, Estimated: >60 mL/min (ref >60)
Glucose, Bld: 89 mg/dL (ref 70–99)
Potassium: 3.9 mmol/L (ref 3.5–5.1)
Sodium: 137 mmol/L (ref 135–145)
Total Bilirubin: 0.6 mg/dL (ref 0.3–1.2)
Total Protein: 6.9 g/dL (ref 6.5–8.1)

## 2021-12-12 LAB — TYPE AND SCREEN
ABO/RH(D): A POS
Antibody Screen: NEGATIVE

## 2021-12-12 LAB — CBC
HCT: 39.4 % (ref 36.0–46.0)
Hemoglobin: 13.3 g/dL (ref 12.0–15.0)
MCH: 29.6 pg (ref 26.0–34.0)
MCHC: 33.8 g/dL (ref 30.0–36.0)
MCV: 87.8 fL (ref 80.0–100.0)
Platelets: 229 10*3/uL (ref 150–400)
RBC: 4.49 MIL/uL (ref 3.87–5.11)
RDW: 13.8 % (ref 11.5–15.5)
WBC: 15.1 10*3/uL — ABNORMAL HIGH (ref 4.0–10.5)
nRBC: 0 % (ref 0.0–0.2)

## 2021-12-12 LAB — PROTEIN / CREATININE RATIO, URINE
Creatinine, Urine: 127 mg/dL
Protein Creatinine Ratio: 0.09 mg/mg{Cre} (ref 0.00–0.15)
Total Protein, Urine: 12 mg/dL

## 2021-12-12 LAB — RPR: RPR Ser Ql: NONREACTIVE

## 2021-12-12 LAB — ABO/RH: ABO/RH(D): A POS

## 2021-12-12 MED ORDER — ZOLPIDEM TARTRATE 5 MG PO TABS: 5.0000 mg | ORAL_TABLET | Freq: Every evening | ORAL | Status: DC | PRN

## 2021-12-12 MED ORDER — MISOPROSTOL 25 MCG QUARTER TABLET
25.0000 ug | ORAL_TABLET | Freq: Once | ORAL | Status: AC
  Administered 2021-12-12: 25 ug via VAGINAL

## 2021-12-12 MED ORDER — DIPHENHYDRAMINE HCL 25 MG PO CAPS: 25.0000 mg | ORAL_CAPSULE | Freq: Four times a day (QID) | ORAL | Status: DC | PRN

## 2021-12-12 MED ORDER — OXYTOCIN 10 UNIT/ML IJ SOLN
INTRAMUSCULAR | Status: AC
  Filled 2021-12-12: qty 2

## 2021-12-12 MED ORDER — LIDOCAINE HCL (PF) 1 % IJ SOLN
INTRAMUSCULAR | Status: AC
  Filled 2021-12-12: qty 30

## 2021-12-12 MED ORDER — WITCH HAZEL-GLYCERIN EX PADS: 1.0000 | MEDICATED_PAD | CUTANEOUS | Status: DC | PRN

## 2021-12-12 MED ORDER — SOD CITRATE-CITRIC ACID 500-334 MG/5ML PO SOLN
30.0000 mL | ORAL | Status: DC | PRN
Start: 2021-12-12 — End: 2021-12-13

## 2021-12-12 MED ORDER — LIDOCAINE HCL (PF) 1 % IJ SOLN: 30.0000 mL | INTRAMUSCULAR | Status: DC | PRN

## 2021-12-12 MED ORDER — DOCUSATE SODIUM 100 MG PO CAPS
100.0000 mg | ORAL_CAPSULE | Freq: Two times a day (BID) | ORAL | Status: DC
  Administered 2021-12-12 – 2021-12-13 (×2): 100 mg via ORAL
  Filled 2021-12-12 (×2): qty 1

## 2021-12-12 MED ORDER — MISOPROSTOL 25 MCG QUARTER TABLET
25.0000 ug | ORAL_TABLET | Freq: Once | ORAL | Status: AC
Start: 2021-12-12 — End: 2021-12-12
  Administered 2021-12-12: 25 ug via ORAL
  Filled 2021-12-12: qty 1

## 2021-12-12 MED ORDER — COCONUT OIL OIL: 1.0000 | TOPICAL_OIL | Status: DC | PRN

## 2021-12-12 MED ORDER — LACTATED RINGERS IV SOLN
500.0000 mL | INTRAVENOUS | Status: DC | PRN
  Administered 2021-12-12: 500 mL via INTRAVENOUS

## 2021-12-12 MED ORDER — ACETAMINOPHEN 500 MG PO TABS
1000.0000 mg | ORAL_TABLET | Freq: Four times a day (QID) | ORAL | Status: DC
  Administered 2021-12-12 – 2021-12-13 (×5): 1000 mg via ORAL
  Filled 2021-12-12 (×5): qty 2

## 2021-12-12 MED ORDER — IBUPROFEN 600 MG PO TABS
600.0000 mg | ORAL_TABLET | Freq: Four times a day (QID) | ORAL | Status: DC
  Administered 2021-12-12 – 2021-12-13 (×5): 600 mg via ORAL
  Filled 2021-12-12 (×5): qty 1

## 2021-12-12 MED ORDER — OXYTOCIN-SODIUM CHLORIDE 30-0.9 UT/500ML-% IV SOLN: 2.5000 [IU]/h | INTRAVENOUS | Status: DC

## 2021-12-12 MED ORDER — ONDANSETRON HCL 4 MG/2ML IJ SOLN
4.0000 mg | Freq: Once | INTRAMUSCULAR | Status: DC
Start: 2021-12-12 — End: 2021-12-13

## 2021-12-12 MED ORDER — AMMONIA AROMATIC IN INHA
RESPIRATORY_TRACT | Status: AC
  Filled 2021-12-12: qty 10

## 2021-12-12 MED ORDER — PRENATAL MULTIVITAMIN CH
1.0000 | ORAL_TABLET | Freq: Every day | ORAL | Status: DC
  Administered 2021-12-12: 1 via ORAL
  Filled 2021-12-12: qty 1

## 2021-12-12 MED ORDER — METOPROLOL SUCCINATE ER 25 MG PO TB24
25.0000 mg | ORAL_TABLET | Freq: Every day | ORAL | Status: DC
  Administered 2021-12-12: 25 mg via ORAL
  Filled 2021-12-12 (×2): qty 1

## 2021-12-12 MED ORDER — TERBUTALINE SULFATE 1 MG/ML IJ SOLN: 0.2500 mg | Freq: Once | INTRAMUSCULAR | Status: DC | PRN

## 2021-12-12 MED ORDER — FENTANYL CITRATE (PF) 100 MCG/2ML IJ SOLN
INTRAMUSCULAR | Status: AC
  Filled 2021-12-12: qty 2

## 2021-12-12 MED ORDER — SIMETHICONE 80 MG PO CHEW: 80.0000 mg | CHEWABLE_TABLET | ORAL | Status: DC | PRN

## 2021-12-12 MED ORDER — ONDANSETRON HCL 4 MG/2ML IJ SOLN
4.0000 mg | Freq: Four times a day (QID) | INTRAMUSCULAR | Status: DC | PRN
  Administered 2021-12-12: 4 mg via INTRAVENOUS
  Filled 2021-12-12: qty 2

## 2021-12-12 MED ORDER — OXYTOCIN-SODIUM CHLORIDE 30-0.9 UT/500ML-% IV SOLN
1.0000 m[IU]/min | INTRAVENOUS | Status: DC
  Filled 2021-12-12: qty 500

## 2021-12-12 MED ORDER — MISOPROSTOL 200 MCG PO TABS
ORAL_TABLET | ORAL | Status: AC
  Filled 2021-12-12: qty 4

## 2021-12-12 MED ORDER — ONDANSETRON HCL 4 MG/2ML IJ SOLN: 4.0000 mg | INTRAMUSCULAR | Status: DC | PRN

## 2021-12-12 MED ORDER — DIBUCAINE (PERIANAL) 1 % EX OINT: 1.0000 | TOPICAL_OINTMENT | CUTANEOUS | Status: DC | PRN

## 2021-12-12 MED ORDER — LACTATED RINGERS IV SOLN: INTRAVENOUS | Status: DC

## 2021-12-12 MED ORDER — FENTANYL CITRATE (PF) 100 MCG/2ML IJ SOLN
100.0000 ug | INTRAMUSCULAR | Status: DC | PRN
  Administered 2021-12-12: 100 ug via INTRAVENOUS

## 2021-12-12 MED ORDER — ONDANSETRON HCL 4 MG PO TABS: 4.0000 mg | ORAL_TABLET | ORAL | Status: DC | PRN

## 2021-12-12 MED ORDER — OXYTOCIN BOLUS FROM INFUSION
333.0000 mL | Freq: Once | INTRAVENOUS | Status: AC
  Administered 2021-12-12: 333 mL via INTRAVENOUS

## 2021-12-12 MED ORDER — SERTRALINE HCL 25 MG PO TABS
25.0000 mg | ORAL_TABLET | Freq: Every day | ORAL | Status: DC
  Administered 2021-12-12: 25 mg via ORAL
  Filled 2021-12-12 (×2): qty 1

## 2021-12-12 MED ORDER — BENZOCAINE-MENTHOL 20-0.5 % EX AERO
1.0000 | INHALATION_SPRAY | CUTANEOUS | Status: DC | PRN
Start: 2021-12-12 — End: 2021-12-13
  Administered 2021-12-12: 1 via TOPICAL
  Filled 2021-12-12 (×2): qty 56

## 2021-12-12 NOTE — Discharge Summary (Signed)
Obstetrical Discharge Summary  Date of Admission: 12/11/2021 Date of Discharge: 12/13/2021  Primary OB: [facility]  Gestational Age at Delivery: [redacted]w[redacted]d   Antepartum complications: none Reason for Admission: SROM Date of Delivery: 12/12/2021 at 0508  Delivered By: Siri Cole, CNM  Delivery Type: spontaneous vaginal delivery Intrapartum complications/course: None mec stained fluid  Anesthesia:  Fentanyl  Placenta: Delivered and expressed via active management. Intact: yes. To pathology: no.  Laceration: none Episiotomy: none EBL: Baby: Liveborn female, APGARs 8/9, weight 3510 g.    Discharge Diagnosis: Term pregnancy, delivered.   Postpartum course: Normal Discharge Vital Signs:  Current Vital Signs 24h Vital Sign Ranges  T (!) 94.4 F (34.7 C) Temp  Avg: 97.4 F (36.3 C)  Min: 94.4 F (34.7 C)  Max: 98.4 F (36.9 C)  BP 131/82 BP  Min: 114/69  Max: 131/82  HR 66 Pulse  Avg: 59  Min: 46  Max: 66  RR 16 Resp  Avg: 17.8  Min: 16  Max: 20  SaO2 97 % Room Air SpO2  Avg: 96.8 %  Min: 96 %  Max: 97 %       24 Hour I/O Current Shift I/O  Time Ins Outs 08/26 0701 - 08/27 0700 In: -  Out: 1000 [Urine:1000] No intake/output data recorded.     Patient Vitals for the past 6 hrs:  BP Temp Temp src Pulse Resp SpO2  12/13/21 0816 131/82 (!) 94.4 F (34.7 C) Oral 66 16 97 %    Discharge Exam:  NAD Perineum: intact Abdomen: firm fundus below the umbilicus, NTTP, non distended, +bowel sounds.   Heart:RRR no MRGs Lungs: CTAB   Recent Labs  Lab 12/12/21 0055 12/13/21 0604  WBC 15.1* 14.1*  HGB 13.3 11.3*  HCT 39.4 33.7*  PLT 229 166    Disposition: Home  Rh Immune globulin given: not applicable Rubella vaccine given: Yes Tdap vaccine given in AP or PP setting: yes, AP Flu vaccine given in AP or PP setting: no  Contraception: vasectomy  Prenatal/Postnatal Panel: A POS Performed at [facility], [address] [place], [place] [address] Samantha Kim Not  immune//Varicella Immune//RPR negative//HIV negative/HepB Surface Ag negative//pap no abnormalities (date: 2021)//plans to breastfeed  Plan:  Samantha Kim was discharged to home in good condition. Follow-up appointment with LMD  in 2 and 6 weeks for a mood check then  PP visit  Future Appointments  Date Time Provider Department Center  12/15/2021  1:15 PM Samantha Kim, Courtney Heys, CNM [facility] None    Discharge Medications: Allergies as of 12/13/2021       Reactions   Cetirizine Other (See Comments)   With severe bronchospasm   Other Other (See Comments)   Asthma flare up antihistamines        Medication List     TAKE these medications    albuterol 108 (90 Base) MCG/ACT inhaler Commonly known as: VENTOLIN HFA Inhale 2 puffs into the lungs as needed.   CLARITIN PO Take by mouth.   metoprolol succinate 25 MG 24 hr tablet Commonly known as: TOPROL-XL Take by mouth.   prenatal multivitamin Tabs tablet Take 1 tablet by mouth daily at 12 noon.   sertraline 25 MG tablet Commonly known as: Zoloft Take 1 tablet (25 mg total) by mouth daily.       Samantha Kim, CNM 12/13/21 11:15 AM

## 2021-12-12 NOTE — Progress Notes (Signed)
Subjective:  Doing well 5 hours postpartum day 0: she is tolerating regular diet, her pain is controlled with PO medication, she is ambulating and voiding without difficulty. She reports some tenderness with latch.   Objective:  Vital signs in last 24 hours: Temp:  [98.2 F (36.8 C)-98.7 F (37.1 C)] 98.7 F (37.1 C) (08/26 0917) Pulse Rate:  [61-85] 66 (08/26 0917) Resp:  [16-20] 20 (08/26 0917) BP: (122-149)/(71-92) 122/77 (08/26 0917) SpO2:  [97 %] 97 % (08/26 0917) Weight:  [86 kg] 86 kg (08/26 0021)    General: NAD Pulmonary: no increased work of breathing Abdomen: non-distended, non-tender, fundus firm at level of umbilicus Extremities: no edema, no erythema, no tenderness  Results for orders placed or performed during the hospital encounter of 12/11/21 (from the past 72 hour(s))  Protein / creatinine ratio, urine     Status: None   Collection Time: 12/12/21 12:42 AM  Result Value Ref Range   Creatinine, Urine 127 mg/dL   Total Protein, Urine 12 mg/dL    Comment: NO NORMAL RANGE ESTABLISHED FOR THIS TEST   Protein Creatinine Ratio 0.09 0.00 - 0.15 mg/mg[Cre]    Comment: Performed at [facility], [address] [place], [place] [address]  CBC     Status: Abnormal   Collection Time: 12/12/21 12:55 AM  Result Value Ref Range   WBC 15.1 (H) 4.0 - 10.5 K/uL   RBC 4.49 3.87 - 5.11 MIL/uL   Hemoglobin 13.3 12.0 - 15.0 g/dL   HCT 96.2 95.2 - 84.1 %   MCV 87.8 80.0 - 100.0 fL   MCH 29.6 26.0 - 34.0 pg   MCHC 33.8 30.0 - 36.0 g/dL   RDW 32.4 40.1 - 02.7 %   Platelets 229 150 - 400 K/uL   nRBC 0.0 0.0 - 0.2 %    Comment: Performed at [facility], [address] [place], [place] [address]  Type and screen [facility]     Status: None   Collection Time: 12/12/21 12:55 AM  Result Value Ref Range   ABO/RH(D) A POS    Antibody Screen NEG    Sample Expiration      12/15/2021,2359 Performed at [facility], [address] [place], [place] [address]   RPR     Status: None   Collection Time: 12/12/21 12:55 AM  Result Value Ref Range   RPR Ser Ql NON REACTIVE NON REACTIVE    Comment: Performed at [facility], [address] [place], [place] [address]  Comprehensive metabolic panel     Status: Abnormal   Collection Time: 12/12/21 12:55 AM  Result Value Ref Range   Sodium 137 135 - 145 mmol/L   Potassium 3.9 3.5 - 5.1 mmol/L   Chloride 110 98 - 111 mmol/L   CO2 20 (L) 22 - 32 mmol/L   Glucose, Bld 89 70 - 99 mg/dL    Comment: Glucose reference range applies only to samples taken after fasting for at least 8 hours.   BUN 11 6 - 20 mg/dL   Creatinine, Ser 5.63 0.44 - 1.00 mg/dL   Calcium 8.9 8.9 - 87.5 mg/dL   Total Protein 6.9 6.5 - 8.1 g/dL   Albumin 3.1 (L) 3.5 - 5.0 g/dL   AST 26 15 - 41 U/L   ALT 16 0 - 44 U/L   Alkaline Phosphatase 181 (H) 38 - 126 U/L   Total Bilirubin 0.6 0.3 - 1.2 mg/dL  GFR, Estimated >60 >60 mL/min    Comment: (NOTE) Calculated using the CKD-EPI Creatinine Equation (2021)    Anion gap 7 5 - 15    Comment: Performed at [facility], [address] [place], [place] [address]  ABO/Rh     Status: None   Collection Time: 12/12/21  2:05 AM  Result Value Ref Range   ABO/RH(D)      A POS Performed at [facility], [address], [place], [place] [address]     Assessment:   36 y.o. [phone] postpartum day # 0, lactating  Plan:    1) Acute blood loss anemia - hemodynamically stable and asymptomatic - po ferrous sulfate  2) Blood Type --/--/A POS Performed at [facility], [address] [place], [place] [address]  [phone]) / Ish[place]el Holter <0.90 (02/03 0847) / Varicella Immune  3) TDAP status  given antepartum  4) Feeding plan breast  5)  Education given regarding options for contraception, as well as compatibility with breast feeding if applicable.  Patient plans on vasectomy for contraception.  6) Disposition: continue  current care/LC as needed   Tresea Mall, CNM [facility] 12/12/2021, 10:10 AM

## 2021-12-12 NOTE — H&P (Signed)
OB History & Physical   History of Present Illness:  Chief Complaint:   HPI:  Samantha Kim is a 36 y.o. G60P1001 female at [redacted]w[redacted]d dated by 11wkUS.  She presents to L&D for contractions since 1500 and ruptured membranes since 2300. She was seen on 8/22 for a ROB, her membranes were swept during that appointment. Around 3pm today she started contracting, over time they did get stronger but since arriving to the unit she reports they are not happening as frequently. At 11pm she felt a gush of fluid that was large enough to fill her shorts. She did not wear a pad in. She endorses +FM. Denies HA, Visual disturbances or RUQ pain.  Samantha Kim had early and regular prenatal care. 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   Pregnancy Issues: 1. AMA 2. Hx PPD on Zoloft  3. HX PVC  4. Asthma   Maternal Medical History:   Past Medical History:  Diagnosis Date   Asthma    Family history of breast cancer    Family history of ovarian cancer    Increased risk of breast cancer 2015   IBIS-28.9%   Post partum depression    Premature ventricular contraction 10/2018    Past Surgical History:  Procedure Laterality Date   WISDOM TOOTH EXTRACTION  2003   age 51    Allergies  Allergen Reactions   Cetirizine Other (See Comments)    With severe bronchospasm   Other Other (See Comments)    Asthma flare up antihistamines    Prior to Admission medications   Medication Sig Start Date End Date Taking? Authorizing Provider  albuterol (PROVENTIL HFA;VENTOLIN HFA) 108 (90 Base) MCG/ACT inhaler Inhale 2 puffs into the lungs as needed. 08/31/16   [provider]  Loratadine (CLARITIN PO) Take by mouth.    [provider]  metoprolol succinate (TOPROL-XL) 25 MG 24 hr tablet Take by mouth. 05/08/19   [provider]  Prenatal Vit-Fe Fumarate-FA (PRENATAL MULTIVITAMIN) TABS tablet Take 1 tablet by mouth daily at 12 noon.    [provider]  sertraline  (ZOLOFT) 25 MG tablet Take 1 tablet (25 mg total) by mouth daily. 11/03/21   Federico Flake, MD     Prenatal care site: [facility]  Social History: She  reports that she has never smoked. She has never used smokeless tobacco. She reports that she does not currently use alcohol. She reports that she does not use drugs.  Family History: family history includes Breast cancer (age of onset: 23) in her maternal grandmother; Breast cancer (age of onset: 6) in her paternal grandmother; Cancer (age of onset: 58) in her maternal grandmother; Cancer (age of onset: 51) in her maternal grandfather; Diabetes in her paternal grandfather; Hypertension in her father, maternal grandfather, maternal grandmother, and mother; Ovarian cancer (age of onset: 19) in her paternal aunt; Stroke in her maternal grandmother; Thyroid disease in her maternal grandmother.   Review of Systems: A full review of systems was performed and negative except as noted in the HPI.     Physical Exam:  Vital Signs: LMP 03/11/2021 (Exact Date)  General: no acute distress.  HEENT: normocephalic, atraumatic Heart: regular rate & rhythm.  No murmurs/rubs/gallops Lungs: clear to auscultation bilaterally, normal respiratory effort Abdomen: soft, gravid, non-tender;  EFW: 7.5 Pelvic:   External: Normal external female genitalia  SSE: light mec fluid pooling on insertion of speculum    Extremities: non-tender, symmetric, no edema bilaterally.  DTRs: +2  Neurologic: Alert & oriented x 3.    No results found for this or any previous visit (from the past 24 hour(s)).  Pertinent Results:  Prenatal Labs: Blood type/Rh A positive   Antibody screen neg  Rubella NON immune  Varicella Immune  RPR NR  HBsAg Neg  HIV NR  GC neg  Chlamydia neg  Genetic screening negative  1 hour GTT 107  3 hour GTT   GBS Negative    IRW:ERXVQMGQ 135, moderate variability, pos accel, neg decel  TOCO:q 1.5-5, soft resting tone  SVE: 1.5/80/-2 per  RN    Cephalic by leopolds  No results found.  Assessment:  Samantha Kim is a 36 y.o. G2P1001 female at [redacted]w[redacted]d with SROM with meconium.   Plan:  Admit to Labor & Delivery Labor: Cytotec ordered, Pitocin when appropriate  CBC, T&S, CMP, UPC, Reg until pitocin , IVF GBS  negative, SROM for mec at 2300 Consents obtained. Continuous efm/toco Pain management: aware of all options, will ask if desired   ----- Samantha Kim, CNM  [facility]

## 2021-12-13 LAB — CBC
HCT: 33.7 % — ABNORMAL LOW (ref 36.0–46.0)
Hemoglobin: 11.3 g/dL — ABNORMAL LOW (ref 12.0–15.0)
MCH: 30.1 pg (ref 26.0–34.0)
MCHC: 33.5 g/dL (ref 30.0–36.0)
MCV: 89.9 fL (ref 80.0–100.0)
Platelets: 166 10*3/uL (ref 150–400)
RBC: 3.75 MIL/uL — ABNORMAL LOW (ref 3.87–5.11)
RDW: 13.7 % (ref 11.5–15.5)
WBC: 14.1 10*3/uL — ABNORMAL HIGH (ref 4.0–10.5)
nRBC: 0 % (ref 0.0–0.2)

## 2021-12-13 MED ORDER — MEASLES, MUMPS & RUBELLA VAC IJ SOLR
0.5000 mL | Freq: Once | INTRAMUSCULAR | Status: AC
  Administered 2021-12-13: 0.5 mL via SUBCUTANEOUS
  Filled 2021-12-13 (×2): qty 0.5

## 2021-12-13 NOTE — Discharge Instructions (Signed)

## 2021-12-13 NOTE — Progress Notes (Signed)
Patient discharged home with family.  Discharge instructions, when to follow up, and prescriptions reviewed with patient.  Patient verbalized understanding. Patient escorted to car for discharge with RN.

## 2021-12-13 NOTE — Final Progress Note (Signed)
Post Partum Day 1 Subjective: Samantha Kim is feeling well and is ready to go home. She is ambulating, voiding, and tolerating POs without difficulty. Her pain is well-controlled and her bleeding is minimal. Her mood is stable, and she does not currently have symptoms of anxiety.  Objective: Blood pressure 131/82, pulse 66, temperature (!) 94.4 F (34.7 C), temperature source Oral, resp. rate 16, height 5' 6.5" (1.689 m), weight 86 kg, last menstrual period 03/11/2021, SpO2 97 %, unknown if currently breastfeeding.  Physical Exam:  General: alert, cooperative, and appears stated age 36: appropriate Uterine Fundus: firm Incision: N/A DVT Evaluation: No evidence of DVT seen on physical exam.  Recent Labs    12/12/21 0055 12/13/21 0604  HGB 13.3 11.3*  HCT 39.4 33.7*    Assessment/Plan: Discharge home Breastfeeding Discharge teaching complete Samantha Kim feels good on her current dose of Zoloft but knows she can call if it needs to be re-evaluated Mood check in 2 weeks, PP office visit in 6 weeks.   LOS: 1 day   Glenetta Borg, CNM 12/13/2021, 11:16 AM

## 2021-12-15 ENCOUNTER — Encounter: Payer: BC Managed Care – PPO | Admitting: Licensed Practical Nurse

## 2021-12-16 ENCOUNTER — Telehealth: Payer: Self-pay

## 2021-12-16 NOTE — Telephone Encounter (Signed)
Pt calling; delivered on the 26th; is having significant diarrhea - watery; can keep drinking and eating bland food; is there something she can take that is breastfeeding friendly?  [phone]  Pt states she does not have a fever; baby is fine - is having normal diapers.  Pt asked if she could take some Immodium; adv she could as well as Kaopectate or Donogel PG; and to stay hydrated (per old protocol)

## 2021-12-17 ENCOUNTER — Emergency Department
Admission: EM | Admit: 2021-12-17 | Discharge: 2021-12-17 | Disposition: A | Discharge status: Home / Self Care | Payer: BC Managed Care – PPO | Attending: Emergency Medicine | Admitting: Emergency Medicine

## 2021-12-17 ENCOUNTER — Emergency Department: Payer: BC Managed Care – PPO

## 2021-12-17 ENCOUNTER — Encounter: Payer: Self-pay | Admitting: Emergency Medicine

## 2021-12-17 DIAGNOSIS — R197 Diarrhea, unspecified: Secondary | ICD-10-CM | POA: Insufficient documentation

## 2021-12-17 DIAGNOSIS — R112 Nausea with vomiting, unspecified: Secondary | ICD-10-CM | POA: Diagnosis present

## 2021-12-17 DIAGNOSIS — E860 Dehydration: Secondary | ICD-10-CM | POA: Insufficient documentation

## 2021-12-17 DIAGNOSIS — R1084 Generalized abdominal pain: Secondary | ICD-10-CM | POA: Diagnosis not present

## 2021-12-17 LAB — URINALYSIS, ROUTINE W REFLEX MICROSCOPIC
Bacteria, UA: NONE SEEN
Bilirubin Urine: NEGATIVE
Glucose, UA: NEGATIVE mg/dL
Ketones, ur: NEGATIVE mg/dL
Leukocytes,Ua: NEGATIVE
Nitrite: NEGATIVE
Protein, ur: NEGATIVE mg/dL
Specific Gravity, Urine: 1.003 — ABNORMAL LOW (ref 1.005–1.030)
Squamous Epithelial / HPF: NONE SEEN (ref 0–5)
pH: 5 (ref 5.0–8.0)

## 2021-12-17 LAB — COMPREHENSIVE METABOLIC PANEL
ALT: 64 U/L — ABNORMAL HIGH (ref 0–44)
AST: 44 U/L — ABNORMAL HIGH (ref 15–41)
Albumin: 3.1 g/dL — ABNORMAL LOW (ref 3.5–5.0)
Alkaline Phosphatase: 107 U/L (ref 38–126)
Anion gap: 10 (ref 5–15)
BUN: 40 mg/dL — ABNORMAL HIGH (ref 6–20)
CO2: 16 mmol/L — ABNORMAL LOW (ref 22–32)
Calcium: 7.4 mg/dL — ABNORMAL LOW (ref 8.9–10.3)
Chloride: 113 mmol/L — ABNORMAL HIGH (ref 98–111)
Creatinine, Ser: 2.76 mg/dL — ABNORMAL HIGH (ref 0.44–1.00)
GFR, Estimated: 22 mL/min — ABNORMAL LOW (ref >60)
Glucose, Bld: 102 mg/dL — ABNORMAL HIGH (ref 70–99)
Potassium: 3.7 mmol/L (ref 3.5–5.1)
Sodium: 139 mmol/L (ref 135–145)
Total Bilirubin: 0.6 mg/dL (ref 0.3–1.2)
Total Protein: 6.8 g/dL (ref 6.5–8.1)

## 2021-12-17 LAB — CBC WITH DIFFERENTIAL/PLATELET
Abs Immature Granulocytes: 0.09 10*3/uL — ABNORMAL HIGH (ref 0.00–0.07)
Basophils Absolute: 0 10*3/uL (ref 0.0–0.1)
Basophils Relative: 0 %
Eosinophils Absolute: 0 10*3/uL (ref 0.0–0.5)
Eosinophils Relative: 0 %
HCT: 38.9 % (ref 36.0–46.0)
Hemoglobin: 13.2 g/dL (ref 12.0–15.0)
Immature Granulocytes: 1 %
Lymphocytes Relative: 6 %
Lymphs Abs: 0.7 10*3/uL (ref 0.7–4.0)
MCH: 29.9 pg (ref 26.0–34.0)
MCHC: 33.9 g/dL (ref 30.0–36.0)
MCV: 88 fL (ref 80.0–100.0)
Monocytes Absolute: 1.3 10*3/uL — ABNORMAL HIGH (ref 0.1–1.0)
Monocytes Relative: 12 %
Neutro Abs: 8.5 10*3/uL — ABNORMAL HIGH (ref 1.7–7.7)
Neutrophils Relative %: 81 %
Platelets: 230 10*3/uL (ref 150–400)
RBC: 4.42 MIL/uL (ref 3.87–5.11)
RDW: 13.7 % (ref 11.5–15.5)
WBC: 10.5 10*3/uL (ref 4.0–10.5)
nRBC: 0 % (ref 0.0–0.2)

## 2021-12-17 LAB — LIPASE, BLOOD: Lipase: 34 U/L (ref 11–51)

## 2021-12-17 LAB — HCG, QUANTITATIVE, PREGNANCY: hCG, Beta Chain, Quant, S: 31 m[IU]/mL — ABNORMAL HIGH (ref <5)

## 2021-12-17 MED ORDER — ONDANSETRON 4 MG PO TBDP: 4.0000 mg | ORAL_TABLET | Freq: Three times a day (TID) | ORAL | 0 refills | Status: AC | PRN

## 2021-12-17 MED ORDER — SODIUM CHLORIDE 0.9 % IV BOLUS
1000.0000 mL | Freq: Once | INTRAVENOUS | Status: AC
  Administered 2021-12-17: 1000 mL via INTRAVENOUS

## 2021-12-17 MED ORDER — ONDANSETRON HCL 4 MG/2ML IJ SOLN
4.0000 mg | Freq: Once | INTRAMUSCULAR | Status: AC
  Administered 2021-12-17: 4 mg via INTRAVENOUS
  Filled 2021-12-17: qty 2

## 2021-12-17 NOTE — ED Triage Notes (Signed)
Pt post partem x5 days with c/o N/V/D and 10/10 abdominal pain/cramping x1 day. Per pt, normal vaginal birth, no change to post partem bleeding. Pt sts she has hit max on Imodium and Kaopectate with no relief.

## 2021-12-17 NOTE — ED Notes (Signed)
D/C and new RX discussed with pt. Pt ambulatory with steady gait on D/C.

## 2021-12-17 NOTE — ED Provider Notes (Signed)
[facility] Provider Note    Event Date/Time   First MD Initiated Contact with Patient 12/17/21 [phone]     (approximate)   History   Chief Complaint: Emesis   HPI  Samantha Kim is a 36 y.o. female G2, P2 postpartum from full-term NSVD 5 days ago with uncomplicated course who comes to the ED today complaining of nausea vomiting diarrhea and generalized abdominal pain for the past 24 hours.  Estimates approximately 40 episodes of diarrhea.  No relief with Imodium and Kaopectate at home.  No fever.  No change to postpartum vaginal bleeding/lochia, which has gradually decreased.  After triage patient was given a 1 L IV fluid bolus as well as IV Zofran.  She now feels 100% better, ambulatory and comfortable.  Newborn is doing well.  Breast-feeding going well, newborn gaining weight.     Physical Exam   Triage Vital Signs: ED Triage Vitals [12/17/21 0155]  Enc Vitals Group     BP (!) 161/109     Pulse Rate 98     Resp (!) 24     Temp 98.2 F (36.8 C)     Temp Source Oral     SpO2 97 %     Weight      Height      Head Circumference      Peak Flow      Pain Score      Pain Loc      Pain Edu?      Excl. in GC?     Most recent vital signs: Vitals:   12/17/21 0155 12/17/21 0636  BP: (!) 161/109 122/86  Pulse: 98 72  Resp: (!) 24 15  Temp: 98.2 F (36.8 C) 98.8 F (37.1 C)  SpO2: 97% 98%    General: Awake, no distress.  CV:  Good peripheral perfusion.  Resp:  Normal effort.  Abd:  No distention.  Soft and nontender. Other:  Good spirits.   ED Results / Procedures / Treatments   Labs (all labs ordered are listed, but only abnormal results are displayed) Labs Reviewed  COMPREHENSIVE METABOLIC PANEL - Abnormal; Notable for the following components:      Result Value   Chloride 113 (*)    CO2 16 (*)    Glucose, Bld 102 (*)    BUN 40 (*)    Creatinine, Ser 2.76 (*)    Calcium 7.4 (*)    Albumin 3.1 (*)    AST 44 (*)    ALT 64  (*)    GFR, Estimated 22 (*)    All other components within normal limits  URINALYSIS, ROUTINE W REFLEX MICROSCOPIC - Abnormal; Notable for the following components:   Color, Urine STRAW (*)    APPearance CLEAR (*)    Specific Gravity, Urine 1.003 (*)    Hgb urine dipstick MODERATE (*)    All other components within normal limits  CBC WITH DIFFERENTIAL/PLATELET - Abnormal; Notable for the following components:   Neutro Abs 8.5 (*)    Monocytes Absolute 1.3 (*)    Abs Immature Granulocytes 0.09 (*)    All other components within normal limits  HCG, QUANTITATIVE, PREGNANCY - Abnormal; Notable for the following components:   hCG, Beta Chain, Quant, S 31 (*)    All other components within normal limits  C DIFFICILE QUICK SCREEN W PCR REFLEX    GASTROINTESTINAL PANEL BY PCR, STOOL (REPLACES STOOL CULTURE)  LIPASE, BLOOD     EKG  RADIOLOGY Ultrasound interpreted by me, negative for retained placenta/POCs.  Radiology report reviewed.  CT abdomen pelvis unremarkable   PROCEDURES:  Procedures   MEDICATIONS ORDERED IN ED: Medications  sodium chloride 0.9 % bolus 1,000 mL (0 mLs Intravenous Stopped 12/17/21 0454)  ondansetron (ZOFRAN) injection 4 mg (4 mg Intravenous Given 12/17/21 0300)     IMPRESSION / MDM / ASSESSMENT AND PLAN / ED COURSE  I reviewed the triage vital signs and the nursing notes.                              Differential diagnosis includes, but is not limited to, viral gastroenteritis, dehydration, AKI, electrolyte abnormality, endometritis, pancreatitis  Patient's presentation is most consistent with acute presentation with potential threat to life or bodily function.  Patient presents with generalized abdominal pain, nausea vomiting and diarrhea in postpartum setting.  Clinically she is nontoxic and very well-appearing and feeling much better after fluids and Zofran.  Vital signs are normal.  Labs show normal urinalysis CBC and lipase.  LFTs are  normal.  Chemistry panel does show hypochloremic non-anion gap metabolic acidosis consistent with vomiting and decreased oral intake.  There is acute renal insufficiency as well.  Potassium is normal.  Given overall good health and rapid improvement with fluids, I think the AKI is prerenal and can be expected to rapidly normalize with hydration.  Exam is benign.  Will p.o. trial, plan to discharge with Zofran.       FINAL CLINICAL IMPRESSION(S) / ED DIAGNOSES   Final diagnoses:  Nausea vomiting and diarrhea  Dehydration     Rx / DC Orders   ED Discharge Orders          Ordered    ondansetron (ZOFRAN-ODT) 4 MG disintegrating tablet  Every 8 hours PRN        12/17/21 0748             Note:  This document was prepared using Dragon voice recognition software and may include unintentional dictation errors.   Sharman Cheek, MD 12/17/21 0800

## 2021-12-17 NOTE — ED Notes (Signed)
Pt presents to ED with c/o of having episodes of vomiting and ABD pain, pt explained pain as "contractions". Pt is 5 days postpartum pt states her bleeding is improving and she is not having constant pain.  Pt states she feels a lot better after some IVF and Zofran. Pt is A&Ox4. Pt ambulatory with steady gait. NAD noted.

## 2021-12-17 NOTE — ED Provider Triage Note (Signed)
Emergency Medicine Provider Triage Evaluation Note  Masa Lubin , a 36 y.o. female  was evaluated in triage.  Pt complains of abdominal pain, nausea/vomiting/diarrhea.  5 days postpartum, breast-feeding.  Review of Systems  Positive: Abdominal pain, nausea/vomiting/diarrhea Negative: No foul lochia  Physical Exam  BP (!) 161/109 (BP Location: Left Arm)   Pulse 98   Temp 98.2 F (36.8 C) (Oral)   Resp (!) 24   SpO2 97%   Breastfeeding Yes  Gen:   Awake, mild distress   Resp:  Normal effort  MSK:   Moves extremities without difficulty  Other:  Periumbilical abdominal tenderness  Medical Decision Making  Medically screening exam initiated at 2:07 AM.  Appropriate orders placed.  Shamicka Inga was informed that the remainder of the evaluation will be completed by another provider, this initial triage assessment does not replace that evaluation, and the importance of remaining in the ED until their evaluation is complete.  Obtain blood work, CT scan, initiate IV fluids and Zofran   Irean Hong, MD 12/17/21 [phone]

## 2021-12-28 ENCOUNTER — Encounter: Payer: Self-pay | Admitting: Licensed Practical Nurse

## 2021-12-28 ENCOUNTER — Ambulatory Visit (INDEPENDENT_AMBULATORY_CARE_PROVIDER_SITE_OTHER): Payer: BC Managed Care – PPO | Admitting: Licensed Practical Nurse

## 2021-12-28 DIAGNOSIS — Z1332 Encounter for screening for maternal depression: Secondary | ICD-10-CM

## 2021-12-28 NOTE — Progress Notes (Signed)
Postpartum Visit  Chief Complaint:  Chief Complaint  Patient presents with   Postpartum Care    History of Present Illness: Patient is a 36 y.o. [id number] presents for postpartum visit.  Date of delivery: 12/12/2021 Type of delivery: Vaginal delivery - Vacuum or forceps assisted  no Episiotomy No.  Laceration: no  Pregnancy or labor problems:  no Any problems since the delivery:  no Bleeding: light, pink-brown Sleep: as expected with NB< gets 7-8/24 hours, 2.5 longest stretch No concerns with voiding, stooling or perineum Mood has been great, pleased with birth experience. On Zoloft  Plans vasectomy/abstinence   Newborn Details:  SINGLETON :  1. Baby's name: female. Birth weight: 3510grams Maternal Details:  Breast Feeding:  yes Post partum depression/anxiety noted:  no Edinburgh Post-Partum Depression Score:  1  Date of last PAP: 16967  normal   Past Medical History:  Diagnosis Date   Asthma    Family history of breast cancer    Family history of ovarian cancer    Increased risk of breast cancer 2015   IBIS-28.9%   Post partum depression    Premature ventricular contraction 10/2018    Past Surgical History:  Procedure Laterality Date   WISDOM TOOTH EXTRACTION  2003   age 75    Prior to Admission medications   Medication Sig Start Date End Date Taking? Authorizing Provider  albuterol (PROVENTIL HFA;VENTOLIN HFA) 108 (90 Base) MCG/ACT inhaler Inhale 2 puffs into the lungs as needed. 08/31/16  Yes [provider]  Loratadine (CLARITIN PO) Take by mouth.   Yes [provider]  metoprolol succinate (TOPROL-XL) 25 MG 24 hr tablet Take by mouth. 05/08/19  Yes [provider]  ondansetron (ZOFRAN-ODT) 4 MG disintegrating tablet Take 1 tablet (4 mg total) by mouth every 8 (eight) hours as needed for nausea or vomiting. 12/17/21  Yes Sharman Cheek, MD  Prenatal Vit-Fe Fumarate-FA (PRENATAL MULTIVITAMIN) TABS tablet Take 1 tablet by mouth daily at  12 noon.   Yes [provider]  sertraline (ZOLOFT) 25 MG tablet Take 1 tablet (25 mg total) by mouth daily. 11/03/21  Yes Federico Flake, MD    Allergies  Allergen Reactions   Cetirizine Other (See Comments)    With severe bronchospasm   Other Other (See Comments)    Asthma flare up antihistamines     Social History   Socioeconomic History   Marital status: Married    Spouse name: Kathlene November   Number of children: Not on file   Years of education: Not on file   Highest education level: Not on file  Occupational History   Occupation: Facilities manager Therapy    Employer: [employer]  Tobacco Use   Smoking status: Never   Smokeless tobacco: Never  Vaping Use   Vaping Use: Never used  Substance and Sexual Activity   Alcohol use: Not Currently    Comment: rarely   Drug use: No   Sexual activity: Yes  Other Topics Concern   Not on file  Social History Narrative   Not on file   Social Determinants of Health   Financial Resource Strain: Not on file  Food Insecurity: Not on file  Transportation Needs: Not on file  Physical Activity: Not on file  Stress: Not on file  Social Connections: Not on file  Intimate Partner Violence: Not on file    Family History  Problem Relation Age of Onset   Hypertension Mother    Hypertension Father    Ovarian cancer Paternal  Aunt 40   Breast cancer Maternal Grandmother 30   Cancer Maternal Grandmother 60       lumg   Hypertension Maternal Grandmother    Stroke Maternal Grandmother    Thyroid disease Maternal Grandmother    Cancer Maternal Grandfather 40       lymphoma   Hypertension Maternal Grandfather    Breast cancer Paternal Grandmother 82   Diabetes Paternal Grandfather     Review of Systems  Constitutional: Negative.   Respiratory: Negative.    Cardiovascular: Negative.   Gastrointestinal: Negative.   Genitourinary: Negative.   Musculoskeletal: Negative.   Psychiatric/Behavioral: Negative.       Physical  Exam BP 110/67   Ht 5\' 4"  (1.626 m)   Wt 165 lb (74.8 kg)   Breastfeeding Yes   BMI 28.32 kg/m   Physical Exam Constitutional:      Appearance: Normal appearance.  Pulmonary:     Effort: Pulmonary effort is normal.  Chest:     Comments: Breasts: lactating, no redness or masses, nipples erect and intact bilaterally.  Infant with good latch on left breat during visit.  Abdominal:     General: Abdomen is flat.     Tenderness: There is no abdominal tenderness.     Comments: DR 1-2 FB Uterus not palpated in abdomen   Musculoskeletal:        General: Normal range of motion.     Cervical back: Normal range of motion.  Neurological:     Mental Status: She is alert.  Psychiatric:        Mood and Affect: Mood normal.       Assessment: 36 y.o. 30 presenting for 2 week postpartum visit  Plan: Problem List Items Addressed This Visit   None    1) Contraception Education given regarding options for contraception, including  vasectomy .  2)  Pap - ASCCP guidelines and rational discussed.  Patient opts for 3 year screening interval  3) Patient underwent screening for postpartum depression with NO concerns noted.  4) Follow up 4 wks for PP exam   [id number], CNM  Carie Caddy, [facility]  12/28/21  4:51 PM

## 2022-01-18 ENCOUNTER — Encounter: Payer: Self-pay | Admitting: Obstetrics & Gynecology

## 2022-01-22 ENCOUNTER — Encounter: Payer: Self-pay | Admitting: Licensed Practical Nurse

## 2022-01-22 ENCOUNTER — Ambulatory Visit (INDEPENDENT_AMBULATORY_CARE_PROVIDER_SITE_OTHER): Payer: BC Managed Care – PPO | Admitting: Licensed Practical Nurse

## 2022-01-22 NOTE — Progress Notes (Signed)
Postpartum Visit  Chief Complaint:  Chief Complaint  Patient presents with   Postpartum Care    History of Present Illness: Patient is a 36 y.o. [id number] presents for postpartum visit.  Date of delivery: 12/12/2021 Type of delivery: Vaginal delivery - Vacuum or forceps assisted  no Episiotomy No.  Laceration: no  Pregnancy or labor problems:  no Any problems since the delivery:  no Bleeding: had some spotting last week, has stopped Sleep: as expected with NB, starting to get longer stretches  No concerns with voiding, stooling or perineum Mood has been great, had one bad day last week dealing with multiple issues at home, on Zoloft 25mg   Has not had IC, husband got vasectomy on 9/19  Has been walking and doing some Peloton exercises Returns to work in Nov, part time only then full time in January  Dental exam: is due    Newborn Details:  SINGLETON :  1. 5 name: female. Birth weight: 3510grams Maternal Details:  Breast Feeding:  yes Post partum depression/anxiety noted:  no Edinburgh Post-Partum Depression Score:  not done today, pt denies any concerns  Date of last PAP: 29937  normal    Past Medical History:  Diagnosis Date   Asthma    Family history of breast cancer    Family history of ovarian cancer    Increased risk of breast cancer 2015   IBIS-28.9%   Post partum depression    Premature ventricular contraction 10/2018    Past Surgical History:  Procedure Laterality Date   WISDOM TOOTH EXTRACTION  2003   age 67    Prior to Admission medications   Medication Sig Start Date End Date Taking? Authorizing Provider  albuterol (PROVENTIL HFA;VENTOLIN HFA) 108 (90 Base) MCG/ACT inhaler Inhale 2 puffs into the lungs as needed. 08/31/16  Yes [provider]  Loratadine (CLARITIN PO) Take by mouth.   Yes [provider]  metoprolol succinate (TOPROL-XL) 25 MG 24 hr tablet Take by mouth. 05/08/19  Yes [provider]  Prenatal Vit-Fe  Fumarate-FA (PRENATAL MULTIVITAMIN) TABS tablet Take 1 tablet by mouth daily at 12 noon.   Yes [provider]  sertraline (ZOLOFT) 25 MG tablet Take 1 tablet (25 mg total) by mouth daily. 11/03/21  Yes Caren Macadam, MD  ondansetron (ZOFRAN-ODT) 4 MG disintegrating tablet Take 1 tablet (4 mg total) by mouth every 8 (eight) hours as needed for nausea or vomiting. Patient not taking: Reported on 01/22/2022 12/17/21   Carrie Mew, MD    Allergies  Allergen Reactions   Cetirizine Other (See Comments)    With severe bronchospasm   Other Other (See Comments)    Asthma flare up antihistamines     Social History   Socioeconomic History   Marital status: Married    Spouse name: Ronalee Belts   Number of children: Not on file   Years of education: Not on file   Highest education level: Not on file  Occupational History   Occupation: Theme park manager Therapy    Employer: [employer]  Tobacco Use   Smoking status: Never   Smokeless tobacco: Never  Vaping Use   Vaping Use: Never used  Substance and Sexual Activity   Alcohol use: Not Currently    Comment: rarely   Drug use: No   Sexual activity: Yes  Other Topics Concern   Not on file  Social History Narrative   Not on file   Social Determinants of Health   Financial Resource Strain: Not on file  Food Insecurity: Not on file  Transportation Needs: Not on file  Physical Activity: Not on file  Stress: Not on file  Social Connections: Not on file  Intimate Partner Violence: Not on file    Family History  Problem Relation Age of Onset   Hypertension Mother    Hypertension Father    Ovarian cancer Paternal Aunt 34   Breast cancer Maternal Grandmother 30   Cancer Maternal Grandmother 75       lumg   Hypertension Maternal Grandmother    Stroke Maternal Grandmother    Thyroid disease Maternal Grandmother    Cancer Maternal Grandfather 23       lymphoma   Hypertension Maternal Grandfather    Breast cancer Paternal  Grandmother 73   Diabetes Paternal Grandfather     Review of Systems  Constitutional: Negative.   HENT: Negative.    Respiratory: Negative.    Cardiovascular: Negative.   Gastrointestinal: Negative.   Genitourinary: Negative.   Musculoskeletal: Negative.   Skin: Negative.   Psychiatric/Behavioral: Negative.       Physical Exam BP (!) 128/94   Ht 5\' 6"  (1.676 m)   Wt 168 lb (76.2 kg)   Breastfeeding Yes   BMI 27.12 kg/m   Physical Exam Constitutional:      Appearance: Normal appearance.  Genitourinary:     Vulva normal.     Genitourinary Comments: Bimanual exam: uterus non gravid, non tender no masses. Adnexa non tender no masses, good tone   Cardiovascular:     Rate and Rhythm: Normal rate and regular rhythm.     Pulses: Normal pulses.     Heart sounds: Normal heart sounds.  Pulmonary:     Effort: Pulmonary effort is normal.     Breath sounds: Normal breath sounds.  Chest:     Comments: Breasts: lactating, no redness or masses, nipples erect and  intact bilaterally  Abdominal:     General: Abdomen is flat.     Tenderness: There is no abdominal tenderness.     Comments: DR 1-2 FB  Musculoskeletal:     Cervical back: Normal range of motion and neck supple.     Right lower leg: No edema.     Left lower leg: No edema.  Neurological:     General: No focal deficit present.     Mental Status: She is alert.  Skin:    General: Skin is warm.  Psychiatric:        Mood and Affect: Mood normal.      Female Chaperone present during breast and/or pelvic exam.  Assessment: 36 y.o. 30 presenting for 6 week postpartum visit  Plan: Problem List Items Addressed This Visit   None    1) Contraception husband had vasectomy 9/19, they will abstain until he is :cleared"  2)  Pap - ASCCP guidelines and rational discussed.  Patient opts for 3 screening interval. Due 2024   3) Patient underwent screening for postpartum depression with NO concerns noted. Will continue  on Zoloft 25mg  daily, will contact this CNM when she desires to wean.    4) Elevated BP, pt will monitor at work, will contact PCP with any concerns.   5)Follow up 1 year for routine annual exam  2025, CNM  [facility]  01/22/22  2:37 PM

## 2022-03-11 IMAGING — MG DIGITAL SCREENING BILAT W/ TOMO W/ CAD
8 series · 8 of 24 positions shown · non-contrast
Comparison: Previous exam(s).

CLINICAL DATA: Screening.

EXAM:
DIGITAL SCREENING BILATERAL MAMMOGRAM WITH TOMO AND CAD

[R CC synth-2D]
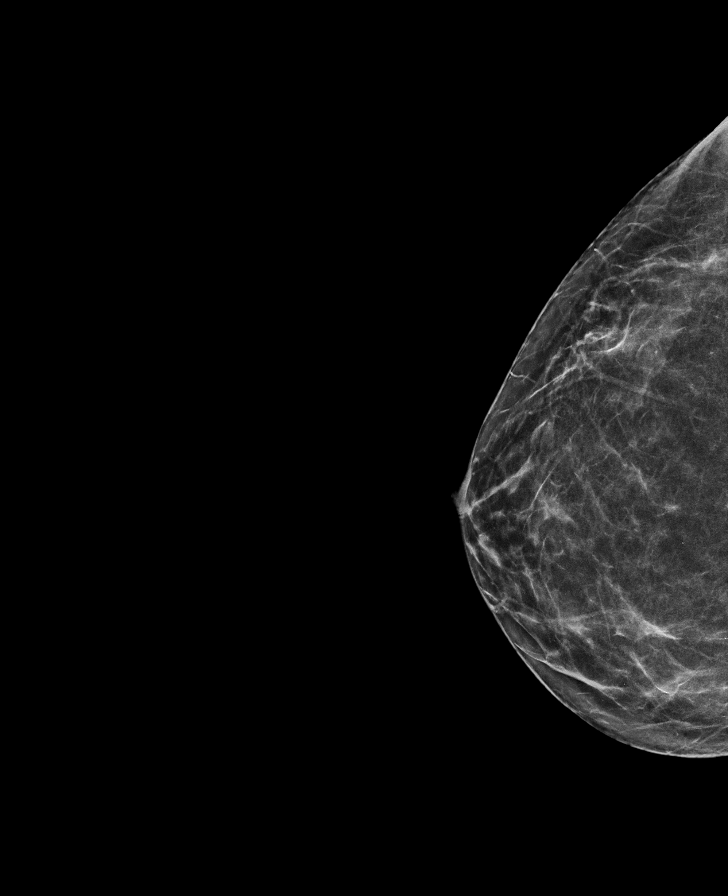

[R MLO synth-2D]
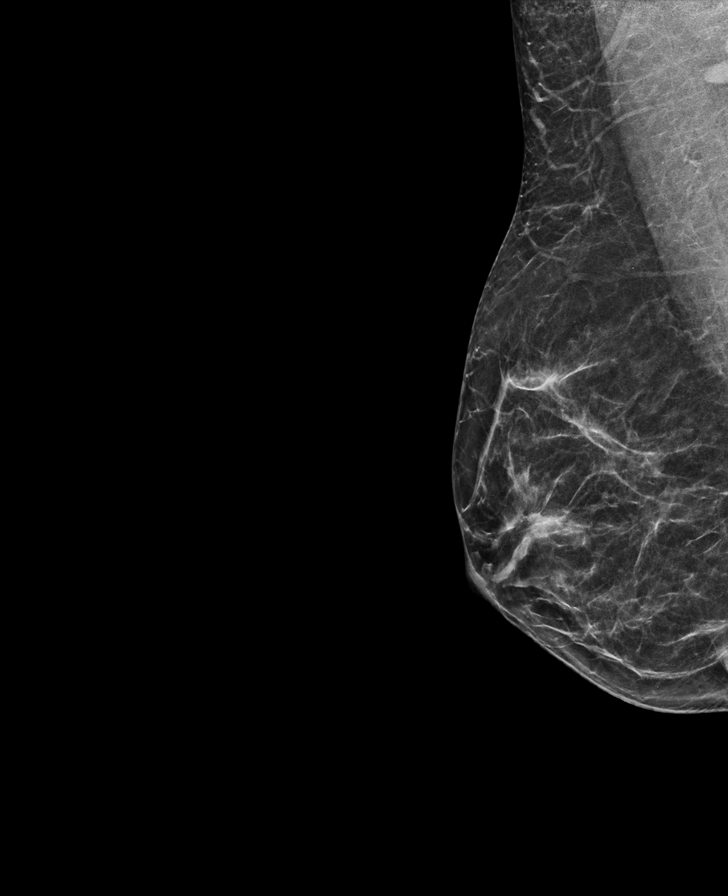

[L MLO synth-2D]
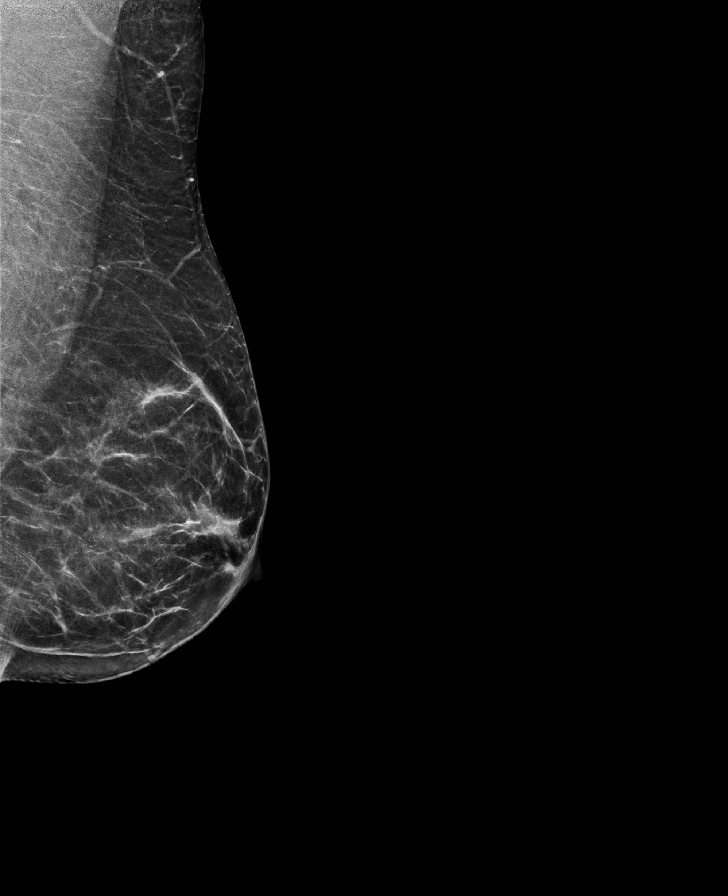

[L CC synth-2D]
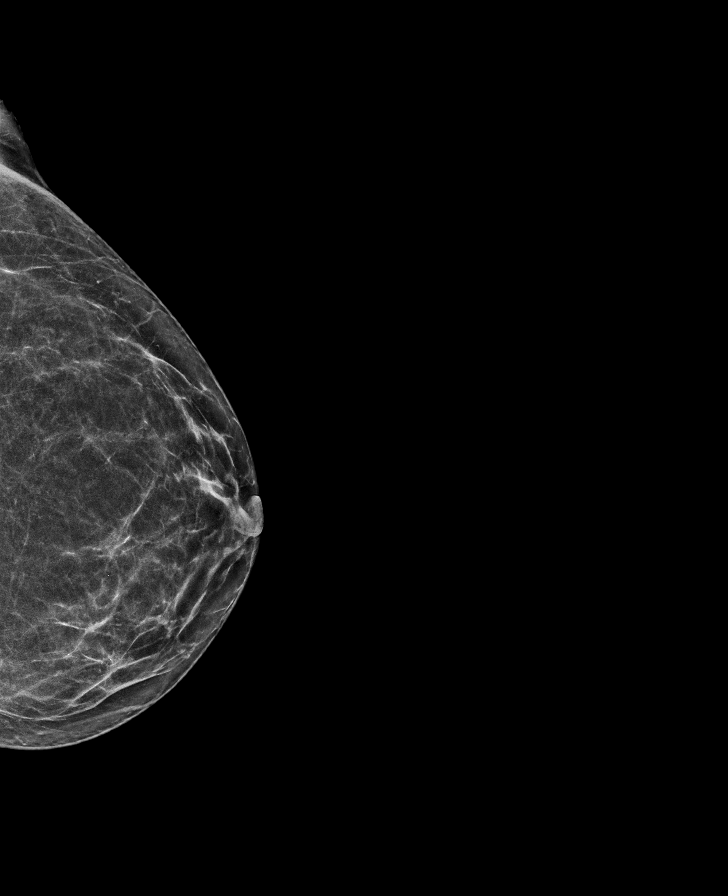

[L CC tomo · tomo slice 20/39.0]
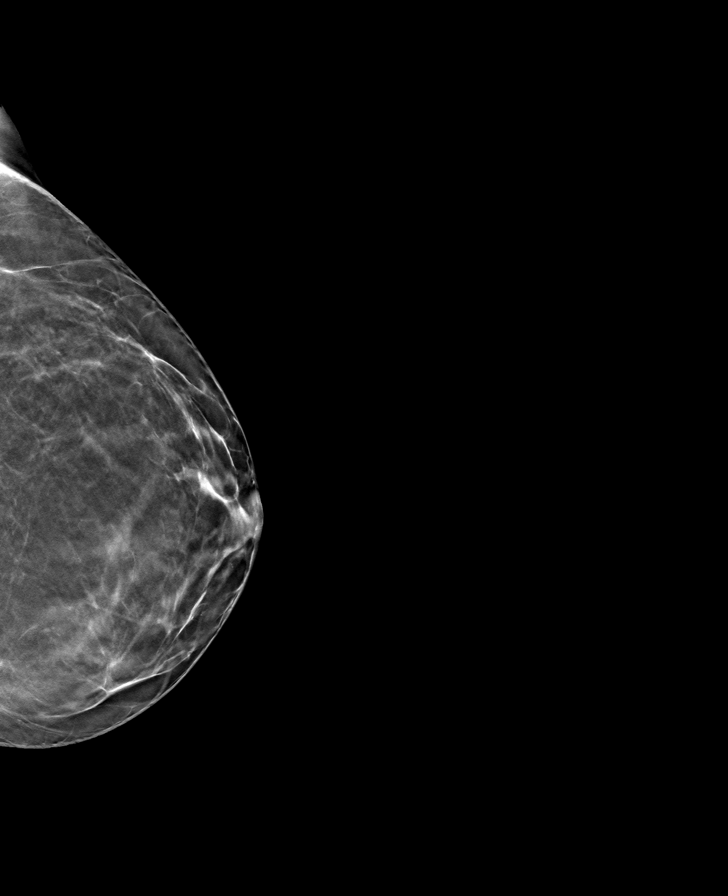

[R MLO tomo · tomo slice 29/57.0]
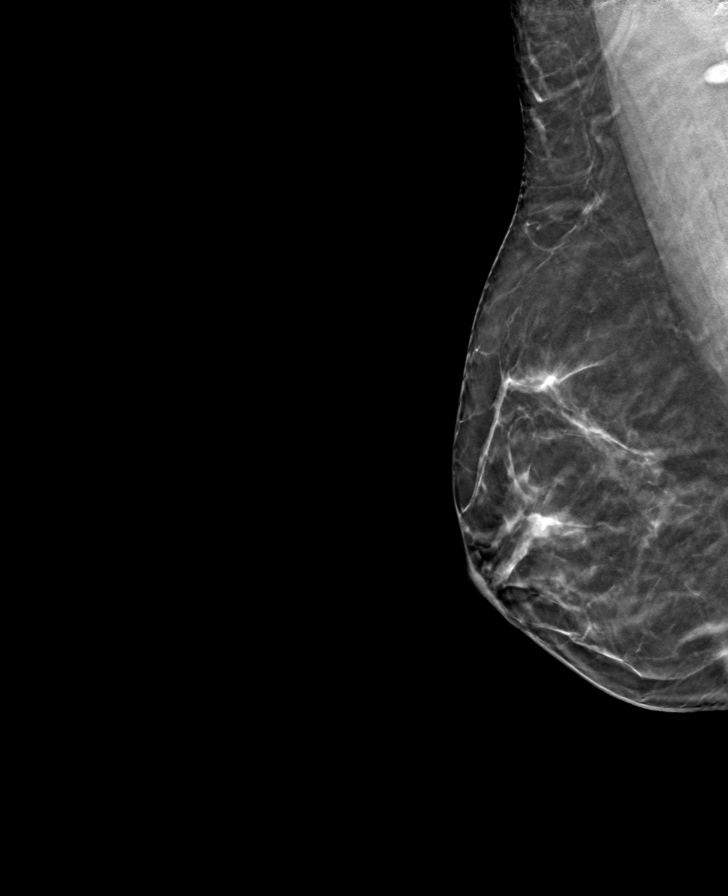

[L MLO tomo · tomo slice 31/61.0]
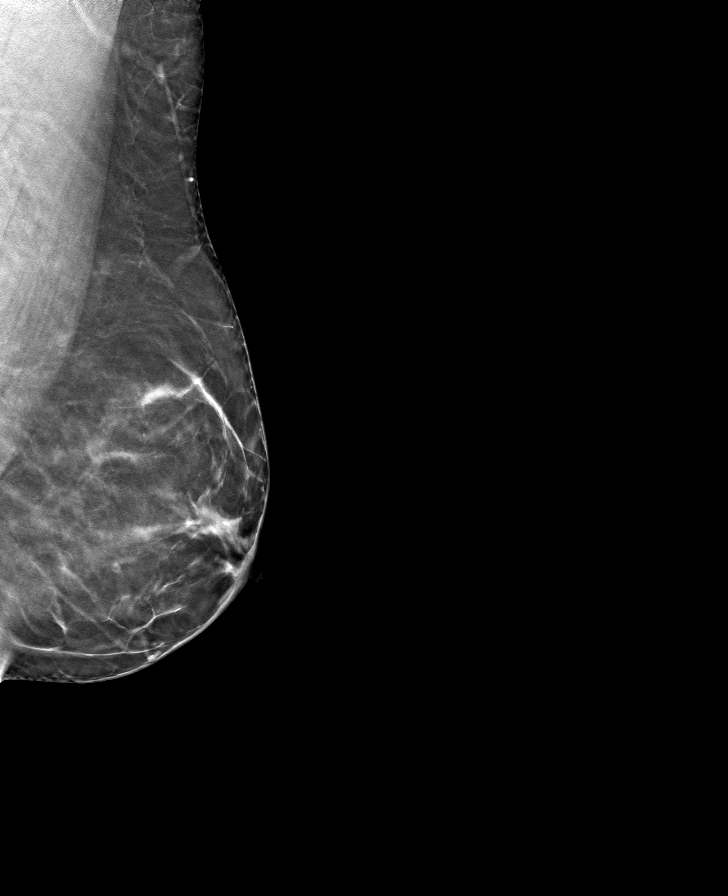

[R CC tomo · tomo slice 29/57.0]
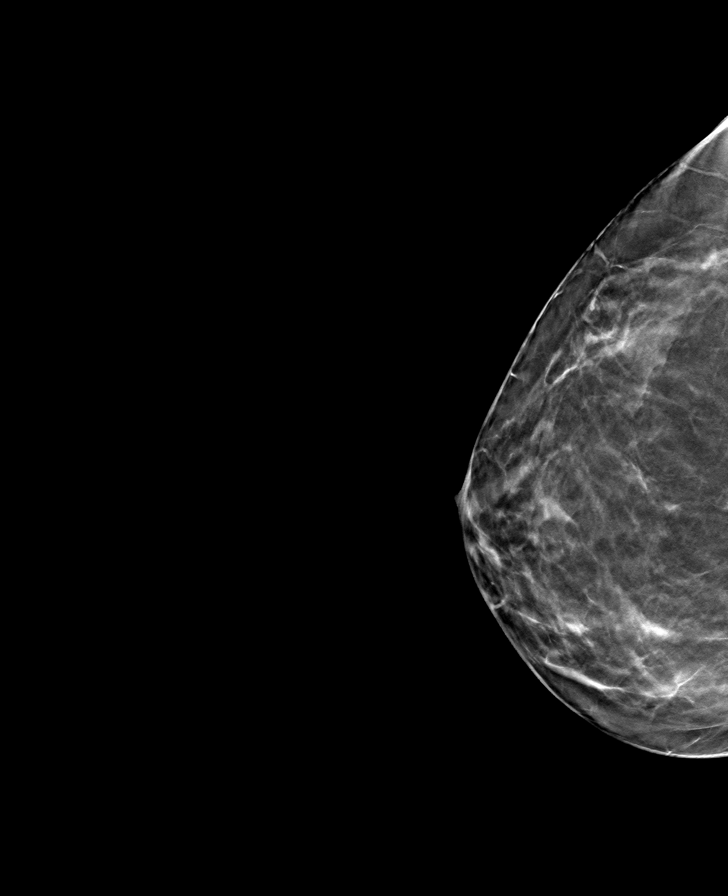

[8 of 24 positions shown; findings below may reference images not displayed]

ACR Breast Density Category b: There are scattered areas of
fibroglandular density.
FINDINGS: There are no findings suspicious for malignancy. Images were
processed with CAD.
IMPRESSION: No mammographic evidence of malignancy. A result letter of this
screening mammogram will be mailed directly to the patient.

RECOMMENDATION:
Screening mammogram at age 39. (Code:2W-9-PY7)

BI-RADS CATEGORY  1: Negative.

## 2023-08-13 IMAGING — US US OB COMP LESS 14 WK
1 series · 14 of 28 positions shown · non-contrast
Comparison: None.

CLINICAL DATA: 35-year-old pregnant female for dating. Estimated
gestational age of 10 weeks 1 day by LMP.

EXAM:
OBSTETRIC <14 WK ULTRASOUND
TECHNIQUE: Transabdominal ultrasound was performed for evaluation of the
gestation as well as the maternal uterus and adnexal regions.

[Series 1: us ob comp less 14 wk · 0.19mm/px · 14 of 62 slices shown]
[im 3/62]
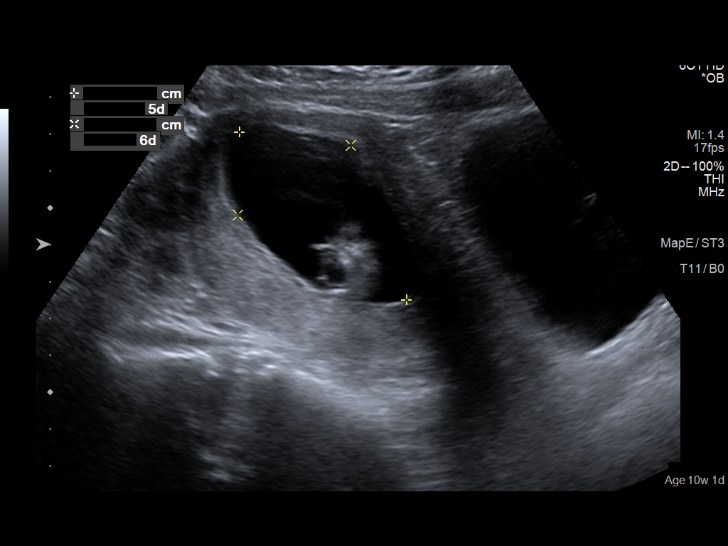
[im 7/62]
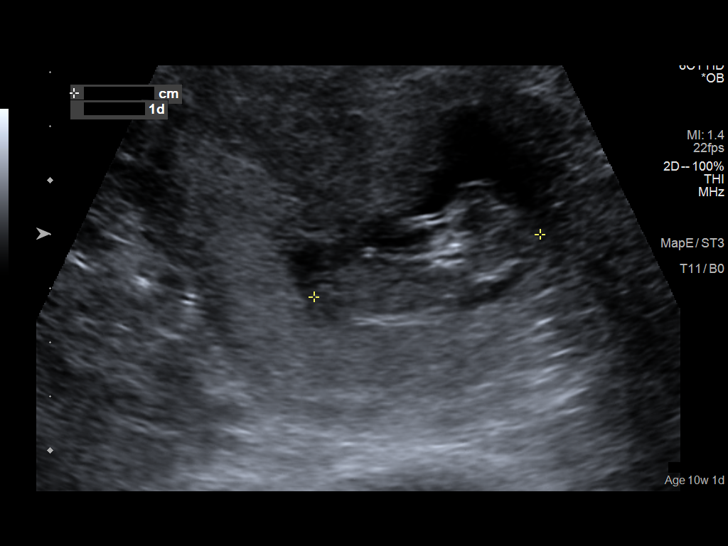
[im 12/62]
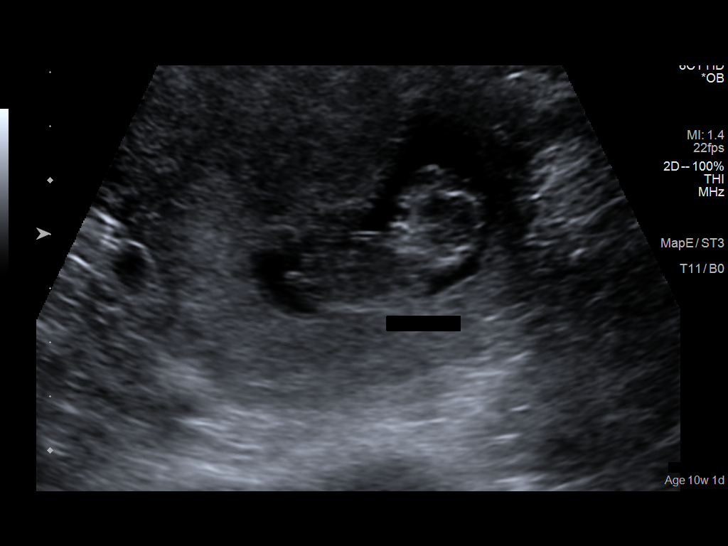
[im 16/62]
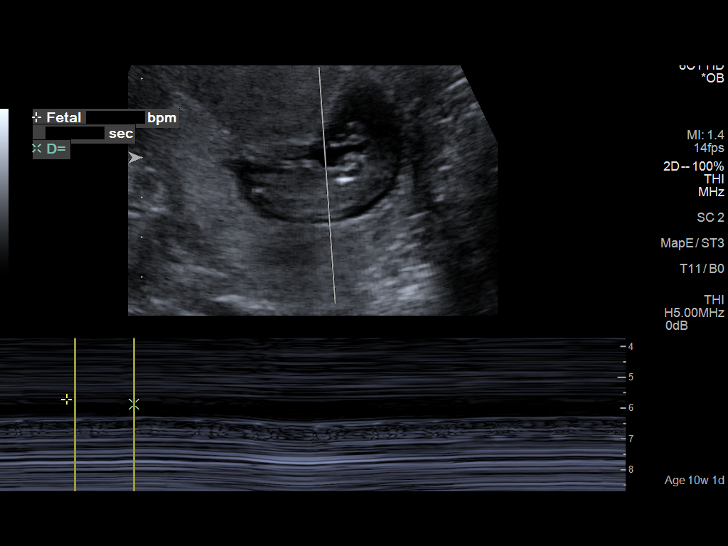
[im 21/62]
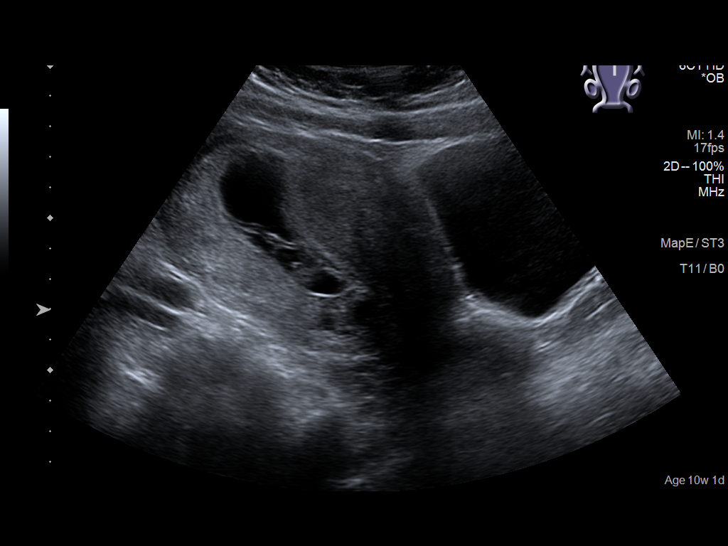
[im 25/62]
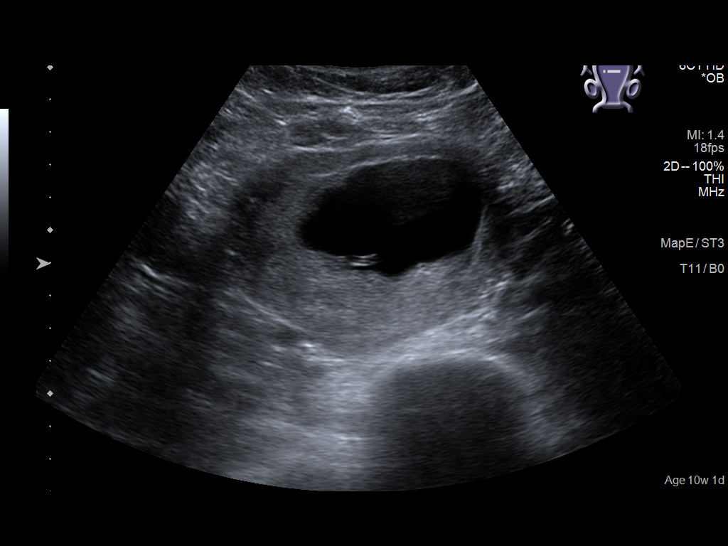
[im 30/62]
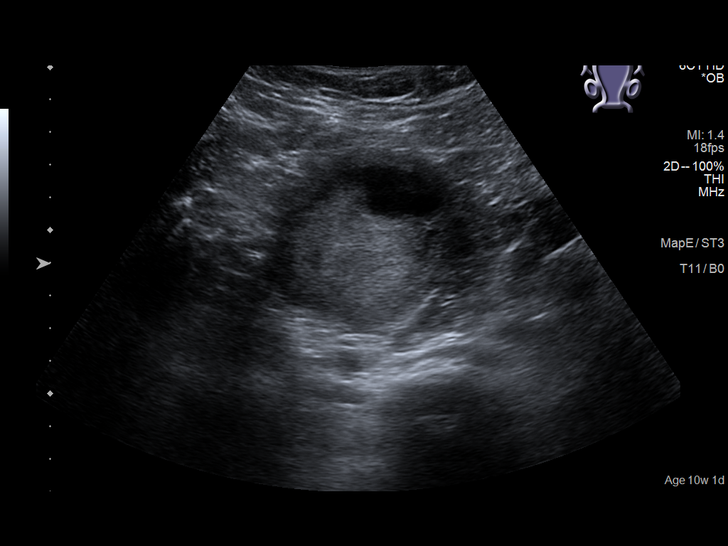
[im 34/62]
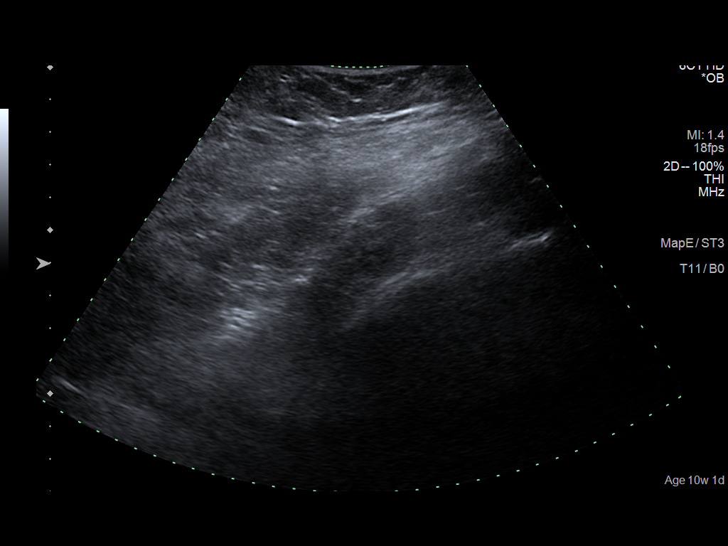
[im 39/62]
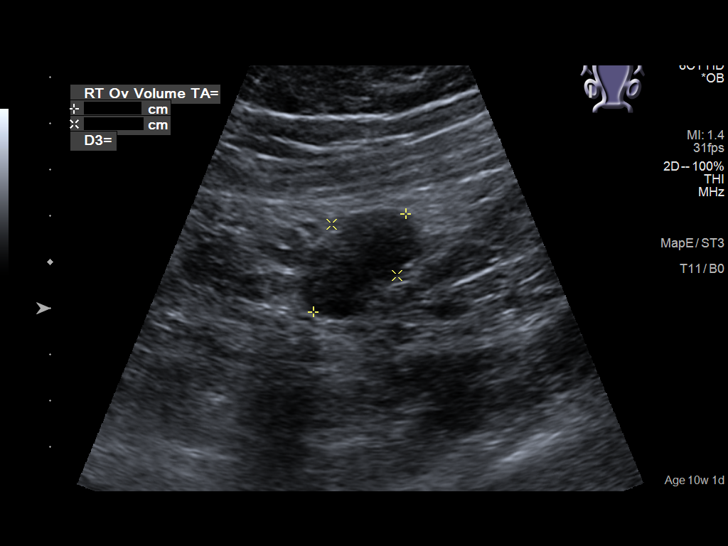
[im 43/62]
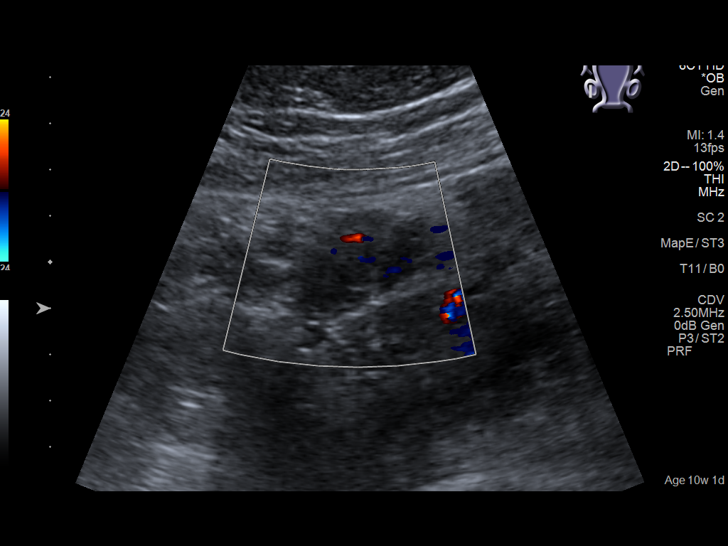
[im 48/62]
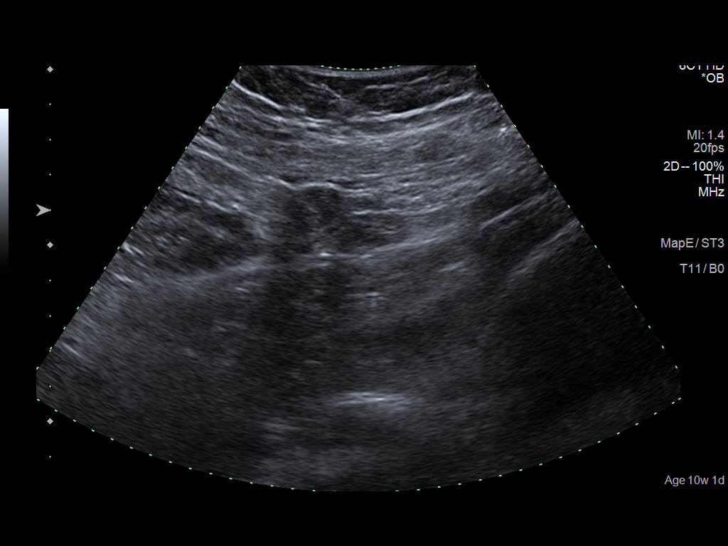
[im 52/62]
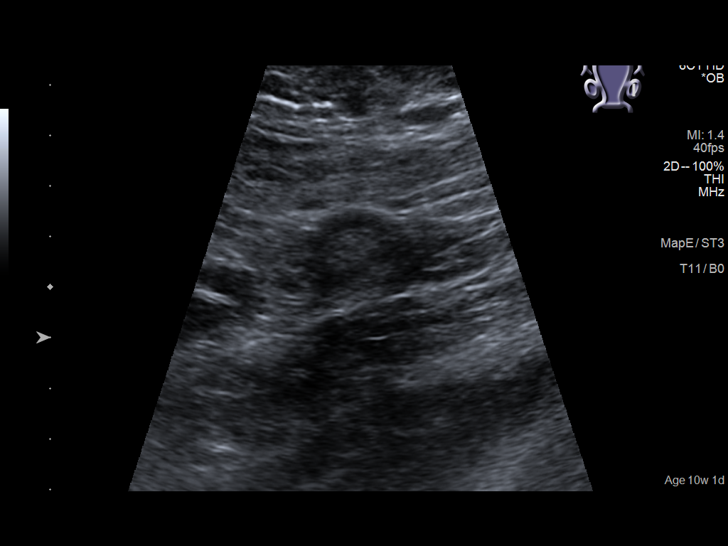
[im 57/62]
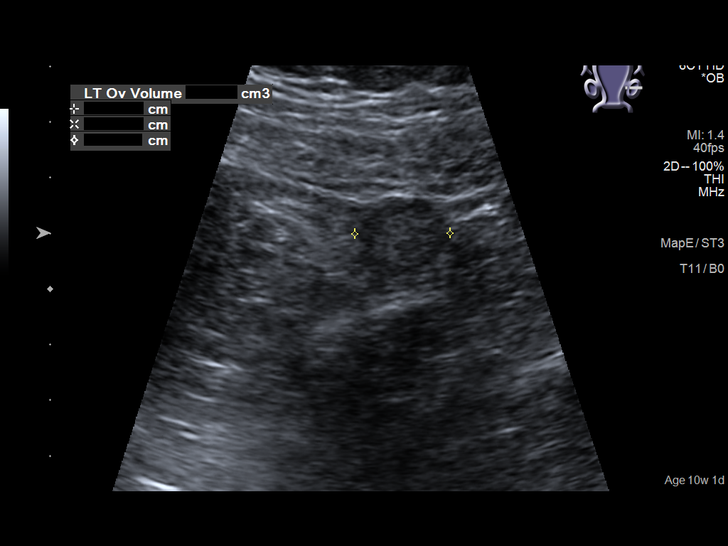
[im 62/62]
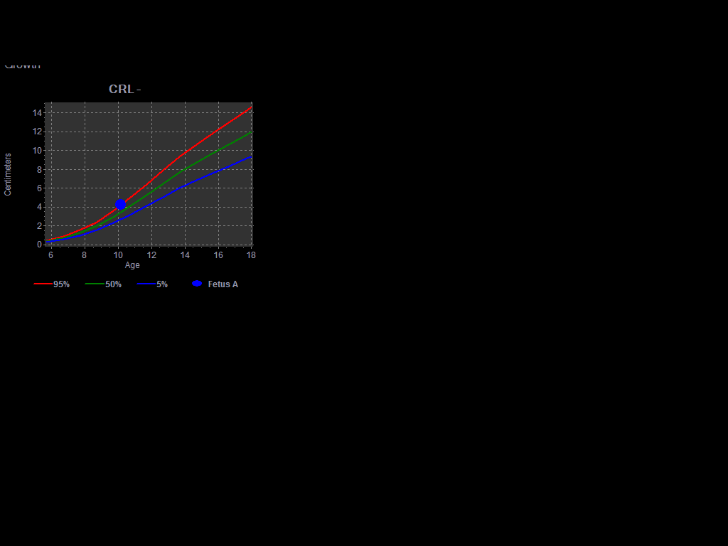

[14 of 28 positions shown; findings below may reference images not displayed]

FINDINGS: Intrauterine gestational sac: Single

Yolk sac:  Visualized.

Embryo:  Visualized.

Cardiac Activity: Visualized.

Heart Rate: 173 bpm

CRL:   42 mm   11 w 1 d                  US EDC: 12/09/2021

Subchorionic hemorrhage:  None visualized.

Maternal uterus/adnexae:

The ovaries bilaterally are unremarkable.

There is no evidence of free fluid or adnexal mass.
IMPRESSION: 1. Single living intrauterine gestation with estimated gestational
age of 11 weeks 1 day by this ultrasound.

## 2023-10-13 IMAGING — US US MFM OB DETAIL+14 WK
1 series · 12 of 28 positions shown · non-contrast
Comparison: none

[Series 1: us mfm ob detail+14 wk · 12 of 102 slices shown]
[im 4/102]
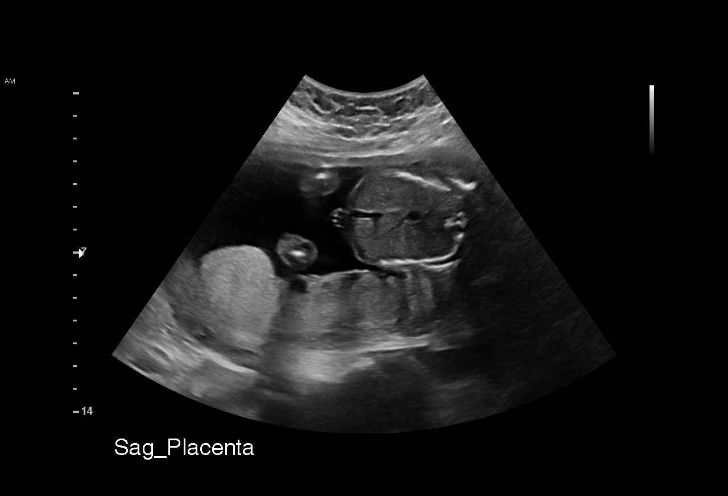
[im 12/102]
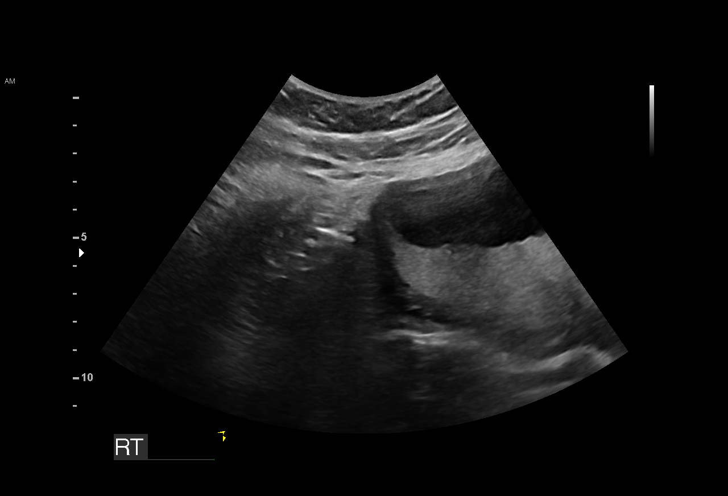
[im 19/102]
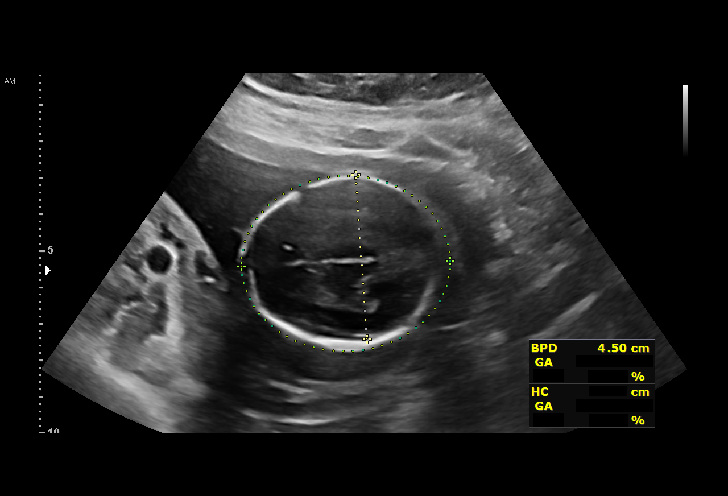
[im 30/102]
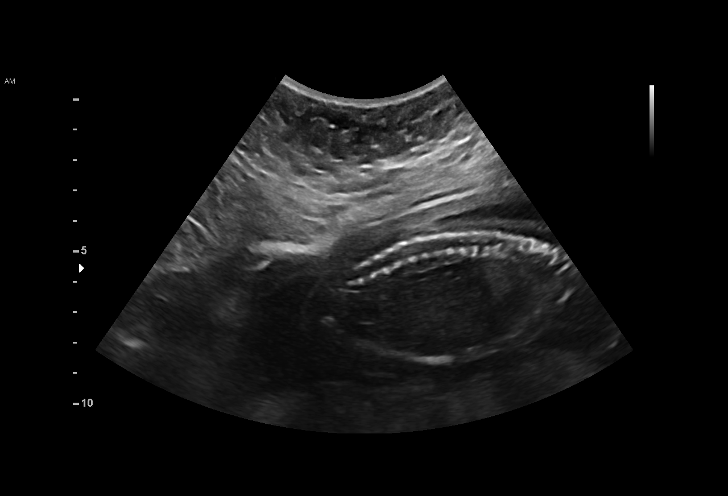
[im 38/102]
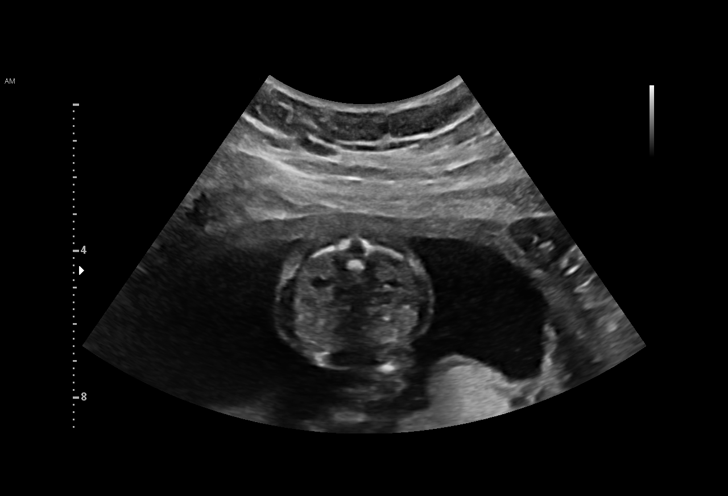
[im 45/102]
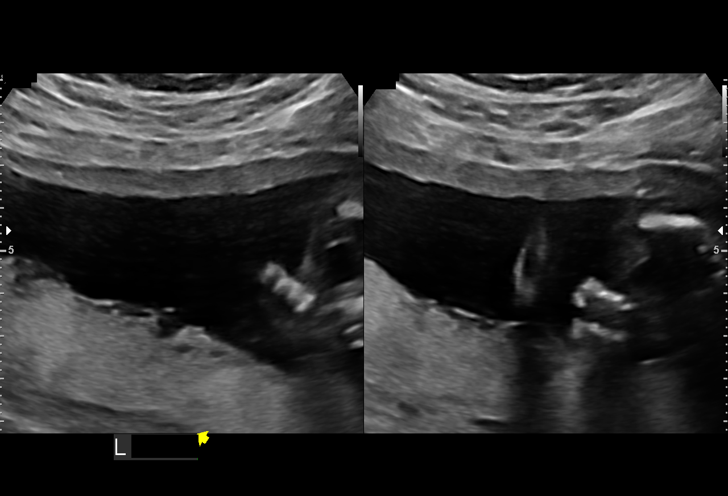
[im 57/102]
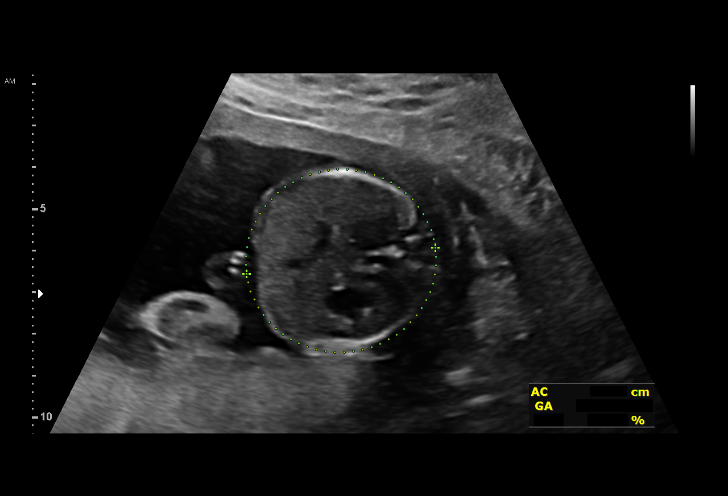
[im 64/102]
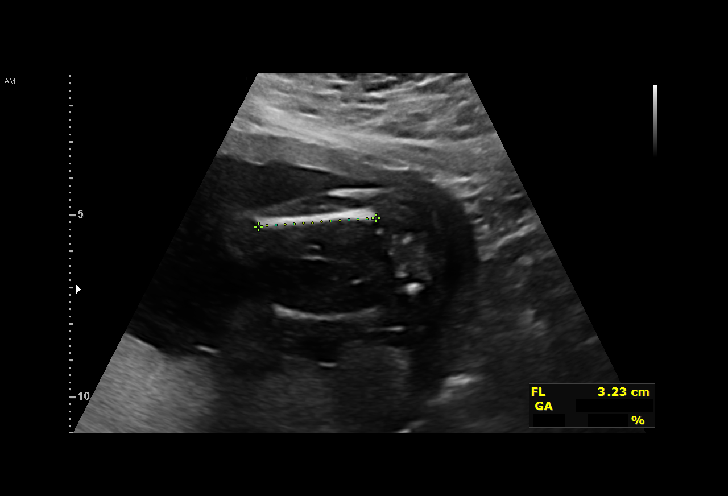
[im 72/102]
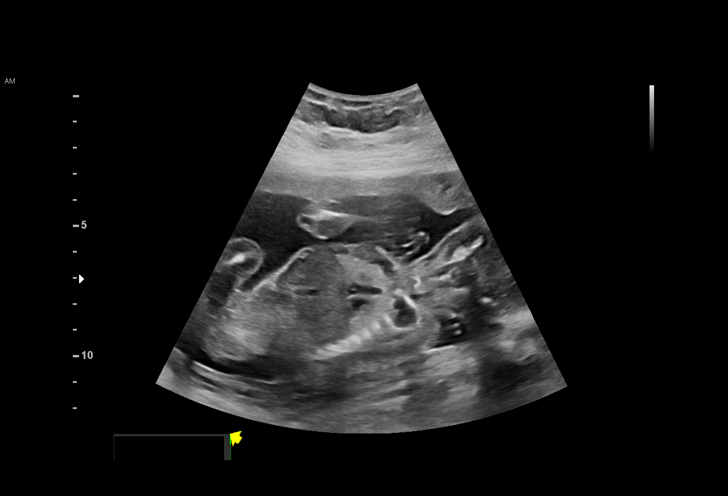
[im 83/102]
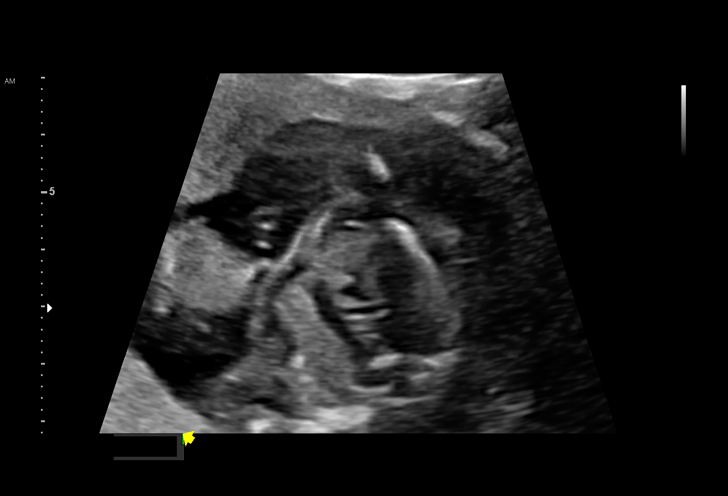
[im 90/102]
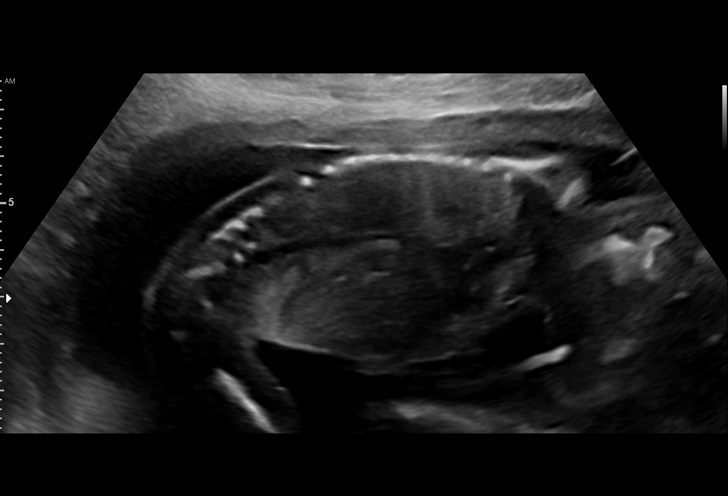
[im 98/102]
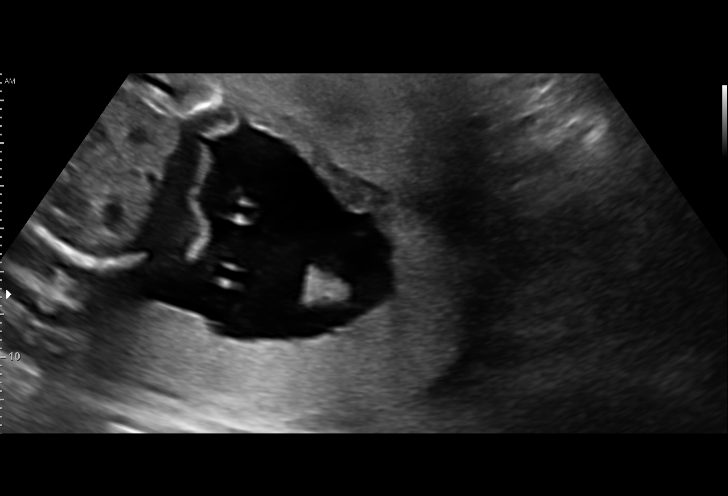

[12 of 28 positions shown; findings below may reference images not displayed]

Indications

 Advanced maternal age multigravida 35+,
 second trimester
 19 weeks gestation of pregnancy
 Antenatal screening for malformations
 LR NIPS
Fetal Evaluation

 Num Of Fetuses:         1
 Fetal Heart Rate(bpm):  150
 Cardiac Activity:       Observed
 Presentation:           Cephalic
 Placenta:               Posterior
 P. Cord Insertion:      Visualized

 Amniotic Fluid
 AFI FV:      Within normal limits
Biometry

 BPD:      44.7  mm     G. Age:  19w 4d         35  %    CI:         74.4   %    70 - 86
                                                         FL/HC:      19.8   %    16.8 -
 HC:      164.5  mm     G. Age:  19w 1d         15  %    HC/AC:      1.16        1.09 -
 AC:      142.2  mm     G. Age:  19w 4d         35  %    FL/BPD:     72.9   %
 FL:       32.6  mm     G. Age:  20w 1d         54  %    FL/AC:      22.9   %    20 - 24
 CER:      20.4  mm     G. Age:  19w 4d         60  %
 NFT:       3.8  mm
 LV:        6.1  mm
 CM:        4.7  mm
 Est. FW:     313  gm    0 lb 11 oz      41  %
OB History

 Gravidity:    2         Term:   1        Prem:   0        SAB:   0
 TOP:          0       Ectopic:  0        Living: 1
Gestational Age

 U/S Today:     19w 4d                                        EDD:   12/11/21
 Best:          19w 6d     Det. By:  Previous Ultrasound      EDD:   12/09/21
                                     (05/21/21)
Anatomy

 Cranium:               Appears normal         Aortic Arch:            Not well visualized
 Cavum:                 Appears normal         Ductal Arch:            Appears normal
 Ventricles:            Appears normal         Diaphragm:              Appears normal
 Choroid Plexus:        Appears normal         Stomach:                Appears normal, left
                                                                       sided
 Cerebellum:            Appears normal         Abdomen:                Appears normal
 Posterior Fossa:       Appears normal         Abdominal Wall:         Appears nml (cord
                                                                       insert, abd wall)
 Nuchal Fold:           Appears normal         Cord Vessels:           Appears normal (3
                                                                       vessel cord)
 Face:                  Orbits nl; profile not Kidneys:                Appear normal
                        well visualized
 Lips:                  Appears normal         Bladder:                Appears normal
 Thoracic:              Appears normal         Spine:                  Appears normal
 Heart:                 Appears normal         Upper Extremities:      Appears normal
                        (4CH, axis, and
                        situs)
 RVOT:                  Appears normal         Lower Extremities:      Appears normal
 LVOT:                  Appears normal

 Other:  VC, 3VV and 3VTV, maxilla, mandible, falx,  Heels/feet and open
         hands/5th digits visualized.
Cervix Uterus Adnexa

 Cervix
 Length:            3.9  cm.
 Normal appearance by transabdominal scan.

 Uterus
 No abnormality visualized.

 Right Ovary
 Not visualized.

 Left Ovary
 Not visualized.
Impression

 We performed a fetal anatomical survey.  Amniotic fluid is
 normal and good fetal activity seen.  No markers of
 aneuploidies or fetal structural defects are seen.  Fetal
 biometry is consistent with the previously established dates.
 [id number]xxxxxxxxxxxxxxxx
 xxxxxxxxxxxxxx
 Consultation (see [REDACTED] )

 I had the pleasure of seeing Ms. Jurado today at [facility]  She was accompanied by her husband.
 She is G2 [id number] at 19w 6d gestation and is here for
 ultrasound and consultation.
 Advanced maternal age.  On cell-free fetal DNA screening,
 the risks of fetal aneuploidies are not increased.
 Past medical history significant for premature ventricular
 contractions (PVC) and the patient is on metoprolol succinate
 25 mg daily.  She reports her PVCs have reduced in
 frequency with treatment.  She does not have any other
 cardiovascular symptoms.
 No history of diabetes or hypertension or any other chronic
 medical conditions.  She has mild intermittent asthma that
 responds to albuterol.

 Past surgical history: Nil of note.
 Medications: Prenatal vitamins, metoprolol, albuterol as
 needed, loratadine as needed.
 Allergies: Altobi (bronchospasm).
 Social history: Denies tobacco or drug or alcohol use.  She
 has been married 13 years and her husband is in good health.
 Obstetric history significant for a term vaginal delivery in 5083
 of a male infant weighing 7 pounds 11 ounces at birth.  Her
 pregnancy and delivery were uncomplicated.
 GYN history: No history of abnormal Pap smears or cervical
 surgeries
 Advanced maternal age
 Advanced maternal age is associated with increased risk of
 chromosomal anomalies.  I discussed the significance and
 limitations of cell free fetal DNA screening.  I explained that
 only amniocentesis will give a definitive result on the fetal
 karyotype.  Cell free fetal DNA screening has a greater
 detection for Down syndrome.
 Advanced maternal age (>40 years) is associated with a
 slight increase incidence of stillbirth.  We recommend weekly
 BPP from 36 weeks gestation till delivery.

 Metoprolol
 It is not associated with increased risk for fetal congenital
 malformations.  A small increase in fetal growth restriction is
 expected in some cases.  I reassured her that it is safe to
 continue in pregnancy.
Recommendations

 -An appointment was made for her to return in 5 weeks for
 completion of fetal anatomy (profile and aortic arch)
 -Fetal growth assessment at 32 and 36 weeks.
 -Weekly BPP from 36 weeks gestation till delivery.
 -Consider delivery at 39 weeks gestation.
                 Kassis, Tsofnat
